# Patient Record
Sex: Female | Born: 1950 | Race: Black or African American | Hispanic: No | Smoking: Never smoker
Health system: Southern US, Community
[De-identification: ages and names within clinical notes are randomized; demographics above are authoritative.]

## PROBLEM LIST (undated history)

## (undated) DIAGNOSIS — R569 Unspecified convulsions: Secondary | ICD-10-CM

## (undated) HISTORY — PX: HERNIA REPAIR: SHX51

## (undated) HISTORY — DX: Unspecified convulsions: R56.9

---

## 2002-04-28 ENCOUNTER — Encounter: Admission: RE | Admit: 2002-04-28 | Discharge: 2002-04-28 | Payer: Self-pay | Admitting: Internal Medicine

## 2002-04-28 ENCOUNTER — Encounter: Payer: Self-pay | Admitting: Internal Medicine

## 2002-05-11 ENCOUNTER — Encounter: Admission: RE | Admit: 2002-05-11 | Discharge: 2002-08-09 | Payer: Self-pay | Admitting: Internal Medicine

## 2006-04-16 ENCOUNTER — Encounter (INDEPENDENT_AMBULATORY_CARE_PROVIDER_SITE_OTHER): Payer: Self-pay | Admitting: Cardiology

## 2006-04-16 ENCOUNTER — Ambulatory Visit (HOSPITAL_COMMUNITY): Admission: RE | Admit: 2006-04-16 | Discharge: 2006-04-16 | Payer: Self-pay | Admitting: *Deleted

## 2008-08-07 ENCOUNTER — Encounter: Admission: RE | Admit: 2008-08-07 | Discharge: 2008-08-07 | Payer: Self-pay | Admitting: Internal Medicine

## 2008-09-19 ENCOUNTER — Encounter: Admission: RE | Admit: 2008-09-19 | Discharge: 2008-09-19 | Payer: Self-pay | Admitting: Internal Medicine

## 2008-12-07 ENCOUNTER — Ambulatory Visit (HOSPITAL_COMMUNITY): Admission: RE | Admit: 2008-12-07 | Discharge: 2008-12-07 | Payer: Self-pay | Admitting: Gastroenterology

## 2010-12-30 NOTE — Op Note (Signed)
NAME:  Sherry Meyer, Sherry Meyer                 ACCOUNT NO.:  [id number]   MEDICAL RECORD NO.:  [id number]          PATIENT TYPE:  AMB   LOCATION:  ENDO                         FACILITY:  [facility]   PHYSICIAN:  Anselmo Rod, M.D.  DATE OF BIRTH:  09/25/1950   DATE OF PROCEDURE:  12/07/2008  DATE OF DISCHARGE:                               OPERATIVE REPORT   PROCEDURE PERFORMED:  Diagnostic colonoscopy.   ENDOSCOPIST:  Anselmo Rod, M.D.   INSTRUMENT USED:  Pentax video colonoscope.   INDICATIONS FOR PROCEDURE:  A 60 year old black female undergoing a  screening colonoscopy to rule out colonic polyps, masses, etc.   PREPROCEDURE PREPARATION:  Informed consent was procured from the  patient. The patient fasted for 8 hours prior to the procedure and  prepped with a bottle of magnesium citrate and MoviPrep the night prior  to the procedure.  Risks and benefits of the procedure including a 10%  miss rate of cancer and polyp were discussed with the patient as well.   PREPROCEDURE PHYSICAL:  The patient had stable vital signs.  NECK:  Supple.  CHEST:  Clear to auscultation.  S1 and S2 regular.  ABDOMEN:  Soft, with normal bowel sounds.   DESCRIPTION OF THE PROCEDURE:  The patient was placed in the left  lateral decubitus position and sedated with 75 mcg of Fentanyl and 8 mg  of Versed given intravenously in slow incremental doses. Once the  patient was adequately sedated and maintained on low-flow oxygen and  continuous cardiac monitoring, the Pentax video colonoscope was advanced  from the rectum to the cecum.  There was a significant amount of  residual stool in the colon.  Multiple washes were done. No masses,  polyps, erosions, ulcerations or diverticula were noted. Retroflexion in  the rectum revealed no abnormalities.  The appendiceal orifice and cecum  were clearly visualized and photographed.  Multiple washes were done as  the scope was gently withdrawn.  The patient tolerated the  procedure  well without immediate complications.   IMPRESSION:  1. Normal colonoscopy up to the cecum. No masses, polyps or      diverticula noted.  2. A large amount of residual stool in the colon, multiple washes, and      small lesions could be missed.   RECOMMENDATIONS:  1. Continue a high fiber diet with liberal fluid intake.  2. Outpatient followup as need arises in the future.  3. Repeat colonoscopy in the next 10 years. If the patient has any      abnormal symptoms in the interim, she is to contact the office      immediately for further recommendations.      Anselmo Rod, M.D.  Electronically Signed     JNM/MEDQ  D:  12/07/2008  T:  12/07/2008  Job:  [id number]   cc:   Candyce Churn. Allyne Gee, M.D.  Fax: [phone]

## 2013-09-04 ENCOUNTER — Emergency Department (HOSPITAL_COMMUNITY)
Admission: EM | Admit: 2013-09-04 | Discharge: 2013-09-04 | Disposition: A | Discharge status: Home / Self Care | Payer: 59 | Source: Home / Self Care

## 2013-09-04 ENCOUNTER — Encounter (HOSPITAL_COMMUNITY): Payer: Self-pay | Admitting: Emergency Medicine

## 2013-09-04 DIAGNOSIS — J069 Acute upper respiratory infection, unspecified: Secondary | ICD-10-CM

## 2013-09-04 NOTE — ED Provider Notes (Signed)
CSN: [id number]     Arrival date & time 09/04/13  0831 History   First MD Initiated Contact with Patient 09/04/13 0914     Chief Complaint  Patient presents with  . Chills   (Consider location/radiation/quality/duration/timing/severity/associated sxs/prior Treatment) HPI Comments: 63 year old female complaining of cough, fever and chills for 3 days. She is sitting on the bed, smiling, pleasant, cooperative in no acute distress.   History reviewed. No pertinent past medical history. History reviewed. No pertinent past surgical history. History reviewed. No pertinent family history. History  Substance Use Topics  . Smoking status: Never Smoker   . Smokeless tobacco: Not on file  . Alcohol Use: No   OB History   Grav Para Term Preterm Abortions TAB SAB Ect Mult Living                 Review of Systems  Constitutional: Positive for activity change and fatigue. Negative for fever, chills and appetite change.  HENT: Positive for congestion, postnasal drip and rhinorrhea. Negative for facial swelling.   Eyes: Negative.   Respiratory: Negative.   Cardiovascular: Negative.   Gastrointestinal: Negative.   Genitourinary: Negative.   Musculoskeletal: Negative for neck pain and neck stiffness.  Skin: Negative for pallor and rash.  Neurological: Negative.     Allergies  Review of patient's allergies indicates no known allergies.  Home Medications  No current outpatient prescriptions on file. BP 137/75  Pulse 70  Temp(Src) 100.5 F (38.1 C) (Oral)  Resp 16  SpO2 97% Physical Exam  Nursing note and vitals reviewed. Constitutional: She is oriented to person, place, and time. She appears well-developed and well-nourished. No distress.  HENT:  Head: Normocephalic.  Right Ear: External ear normal.  Left Ear: External ear normal.  Mouth/Throat: No oropharyngeal exudate.  OP. with scant PND and minor erythema  Eyes: Conjunctivae and EOM are normal.  Neck: Normal range of motion.  Neck supple.  Cardiovascular: Normal rate and regular rhythm.   Murmur heard. Pulmonary/Chest: Effort normal. No respiratory distress. She has no wheezes. She has no rales.  Musculoskeletal: Normal range of motion.  Lymphadenopathy:    She has no cervical adenopathy.  Neurological: She is alert and oriented to person, place, and time. No cranial nerve deficit.  Skin: Skin is warm and dry.  Psychiatric: She has a normal mood and affect.    ED Course  Procedures (including critical care time) Labs Review Labs Reviewed - No data to display Imaging Review No results found.   \  MDM   1. URI (upper respiratory infection)      Home, rest, drink plenty of fluids to stay well hydrated Continue with ibuprofen. Cranial new problems worsening, shortness of breath high fevers severe cough recheck properly.  Hayden Rasmussenavid Alanys Godino, NP 09/04/13 [phone]

## 2013-09-04 NOTE — ED Notes (Signed)
Pt  Reports  Symptoms  Of  Body  Aches    /  Chills    As  Well  As  A  persistant  Non  Productive  Cough  That  Will  Not  Subside           she is  Sitting  Upright on  Exam table  Speaking in  Complete  sentances

## 2013-09-04 NOTE — Discharge Instructions (Signed)
Upper Respiratory Infection, Adult °An upper respiratory infection (URI) is also sometimes known as the common cold. The upper respiratory tract includes the nose, sinuses, throat, trachea, and bronchi. Bronchi are the airways leading to the lungs. Most people improve within 1 week, but symptoms can last up to 2 weeks. A residual cough may last even longer.  °CAUSES °Many different viruses can infect the tissues lining the upper respiratory tract. The tissues become irritated and inflamed and often become very moist. Mucus production is also common. A cold is contagious. You can easily spread the virus to others by oral contact. This includes kissing, sharing a glass, coughing, or sneezing. Touching your mouth or nose and then touching a surface, which is then touched by another person, can also spread the virus. °SYMPTOMS  °Symptoms typically develop 1 to 3 days after you come in contact with a cold virus. Symptoms vary from person to person. They may include: °· Runny nose. °· Sneezing. °· Nasal congestion. °· Sinus irritation. °· Sore throat. °· Loss of voice (laryngitis). °· Cough. °· Fatigue. °· Muscle aches. °· Loss of appetite. °· Headache. °· Low-grade fever. °DIAGNOSIS  °You might diagnose your own cold based on familiar symptoms, since most people get a cold 2 to 3 times a year. Your caregiver can confirm this based on your exam. Most importantly, your caregiver can check that your symptoms are not due to another disease such as strep throat, sinusitis, pneumonia, asthma, or epiglottitis. Blood tests, throat tests, and X-rays are not necessary to diagnose a common cold, but they may sometimes be helpful in excluding other more serious diseases. Your caregiver will decide if any further tests are required. °RISKS AND COMPLICATIONS  °You may be at risk for a more severe case of the common cold if you smoke cigarettes, have chronic heart disease (such as heart failure) or lung disease (such as asthma), or if  you have a weakened immune system. The very young and very old are also at risk for more serious infections. Bacterial sinusitis, middle ear infections, and bacterial pneumonia can complicate the common cold. The common cold can worsen asthma and chronic obstructive pulmonary disease (COPD). Sometimes, these complications can require emergency medical care and may be life-threatening. °PREVENTION  °The best way to protect against getting a cold is to practice good hygiene. Avoid oral or hand contact with people with cold symptoms. Wash your hands often if contact occurs. There is no clear evidence that vitamin C, vitamin E, echinacea, or exercise reduces the chance of developing a cold. However, it is always recommended to get plenty of rest and practice good nutrition. °TREATMENT  °Treatment is directed at relieving symptoms. There is no cure. Antibiotics are not effective, because the infection is caused by a virus, not by bacteria. Treatment may include: °· Increased fluid intake. Sports drinks offer valuable electrolytes, sugars, and fluids. °· Breathing heated mist or steam (vaporizer or shower). °· Eating chicken soup or other clear broths, and maintaining good nutrition. °· Getting plenty of rest. °· Using gargles or lozenges for comfort. °· Controlling fevers with ibuprofen or acetaminophen as directed by your caregiver. °· Increasing usage of your inhaler if you have asthma. °Zinc gel and zinc lozenges, taken in the first 24 hours of the common cold, can shorten the duration and lessen the severity of symptoms. Pain medicines may help with fever, muscle aches, and throat pain. A variety of non-prescription medicines are available to treat congestion and runny nose. Your caregiver   can make recommendations and may suggest nasal or lung inhalers for other symptoms.  HOME CARE INSTRUCTIONS   Only take over-the-counter or prescription medicines for pain, discomfort, or fever as directed by your  caregiver.  Use a warm mist humidifier or inhale steam from a shower to increase air moisture. This may keep secretions moist and make it easier to breathe.  Drink enough water and fluids to keep your urine clear or pale yellow.  Rest as needed.  Return to work when your temperature has returned to normal or as your caregiver advises. You may need to stay home longer to avoid infecting others. You can also use a face mask and careful hand washing to prevent spread of the virus. SEEK MEDICAL CARE IF:   After the first few days, you feel you are getting worse rather than better.  You need your caregiver's advice about medicines to control symptoms.  You develop chills, worsening shortness of breath, or brown or red sputum. These may be signs of pneumonia.  You develop yellow or brown nasal discharge or pain in the face, especially when you bend forward. These may be signs of sinusitis.  You develop a fever, swollen neck glands, pain with swallowing, or white areas in the back of your throat. These may be signs of strep throat. SEEK IMMEDIATE MEDICAL CARE IF:   You have a fever.  You develop severe or persistent headache, ear pain, sinus pain, or chest pain.  You develop wheezing, a prolonged cough, cough up blood, or have a change in your usual mucus (if you have chronic lung disease).  You develop sore muscles or a stiff neck. Document Released: 01/27/2001 Document Revised: 10/26/2011 Document Reviewed: 12/05/2010 [facility] Patient Information 2014 [place], [place].  Viral Infections A viral infection can be caused by different types of viruses.Most viral infections are not serious and resolve on their own. However, some infections may cause severe symptoms and may lead to further complications. SYMPTOMS Viruses can frequently cause:  Minor sore throat.  Aches and pains.  Headaches.  Runny nose.  Different types of rashes.  Watery eyes.  Tiredness.  Cough.  Loss of  appetite.  Gastrointestinal infections, resulting in nausea, vomiting, and diarrhea. These symptoms do not respond to antibiotics because the infection is not caused by bacteria. However, you might catch a bacterial infection following the viral infection. This is sometimes called a "superinfection." Symptoms of such a bacterial infection may include:  Worsening sore throat with pus and difficulty swallowing.  Swollen neck glands.  Chills and a high or persistent fever.  Severe headache.  Tenderness over the sinuses.  Persistent overall ill feeling (malaise), muscle aches, and tiredness (fatigue).  Persistent cough.  Yellow, green, or brown mucus production with coughing. HOME CARE INSTRUCTIONS   Only take over-the-counter or prescription medicines for pain, discomfort, diarrhea, or fever as directed by your caregiver.  Drink enough water and fluids to keep your urine clear or pale yellow. Sports drinks can provide valuable electrolytes, sugars, and hydration.  Get plenty of rest and maintain proper nutrition. Soups and broths with crackers or rice are fine. SEEK IMMEDIATE MEDICAL CARE IF:   You have severe headaches, shortness of breath, chest pain, neck pain, or an unusual rash.  You have uncontrolled vomiting, diarrhea, or you are unable to keep down fluids.  You or your child has an oral temperature above 102 F (38.9 C), not controlled by medicine.  Your baby is older than 3 months with a rectal  temperature of 102 F (38.9 C) or higher.  Your baby is 493 months old or younger with a rectal temperature of 100.4 F (38 C) or higher. MAKE SURE YOU:   Understand these instructions.  Will watch your condition.  Will get help right away if you are not doing well or get worse. Document Released: 05/13/2005 Document Revised: 10/26/2011 Document Reviewed: 12/08/2010 [facility]Exit[facility] Patient Information 2014 [place], [place].  Antibiotic Nonuse  Your caregiver felt that the  infection or problem was not one that would be helped with an antibiotic. Infections may be caused by viruses or bacteria. Only a caregiver can tell which one of these is the likely cause of an illness. A cold is the most common cause of infection in both adults and children. A cold is a virus. Antibiotic treatment will have no effect on a viral infection. Viruses can lead to many lost days of work caring for sick children and many missed days of school. Children may catch as many as 10 "colds" or "flus" per year during which they can be tearful, cranky, and uncomfortable. The goal of treating a virus is aimed at keeping the ill person comfortable. Antibiotics are medications used to help the body fight bacterial infections. There are relatively few types of bacteria that cause infections but there are hundreds of viruses. While both viruses and bacteria cause infection they are very different types of germs. A viral infection will typically go away by itself within 7 to 10 days. Bacterial infections may spread or get worse without antibiotic treatment. Examples of bacterial infections are:  Sore throats (like strep throat or tonsillitis).  Infection in the lung (pneumonia).  Ear and skin infections. Examples of viral infections are:  Colds or flus.  Most coughs and bronchitis.  Sore throats not caused by Strep.  Runny noses. It is often best not to take an antibiotic when a viral infection is the cause of the problem. Antibiotics can kill off the helpful bacteria that we have inside our body and allow harmful bacteria to start growing. Antibiotics can cause side effects such as allergies, nausea, and diarrhea without helping to improve the symptoms of the viral infection. Additionally, repeated uses of antibiotics can cause bacteria inside of our body to become resistant. That resistance can be passed onto harmful bacterial. The next time you have an infection it may be harder to treat if  antibiotics are used when they are not needed. Not treating with antibiotics allows our own immune system to develop and take care of infections more efficiently. Also, antibiotics will work better for us when they are prescribed for bacterial infections. Treatments for a child that is ill may include:  Give extra fluids throughout the day to stay hydrated.  Get plenty of rest.  Only give your child over-the-counter or prescription medicines for pain, discomfort, or fever as directed by your caregiver.  The use of a cool mist humidifier may help stuffy noses.  Cold medications if suggested by your caregiver. Your caregiver may decide to start you on an antibiotic if:  The problem you were seen for today continues for a longer length of time than expected.  You develop a secondary bacterial infection. SEEK MEDICAL CARE IF:  Fever lasts longer than 5 days.  Symptoms continue to get worse after 5 to 7 days or become severe.  Difficulty in breathing develops.  Signs of dehydration develop (poor drinking, rare urinating, dark colored urine).  Changes in behavior or worsening tiredness (listlessness  or lethargy). Document Released: 10/12/2001 Document Revised: 10/26/2011 Document Reviewed: 04/10/2009 [facility] Patient Information 2014 [place], [place].

## 2013-09-04 NOTE — ED Provider Notes (Signed)
Medical screening examination/treatment/procedure(s) were performed by non-physician practitioner and as supervising physician I was immediately available for consultation/collaboration.  Leslee Homeavid Kahlie Deutscher, M.D.  Reuben Likesavid C Nyeem Stoke, MD 09/04/13 [phone]

## 2015-09-20 ENCOUNTER — Emergency Department (HOSPITAL_COMMUNITY)
Admission: EM | Admit: 2015-09-20 | Discharge: 2015-09-20 | Disposition: A | Discharge status: Home / Self Care | Payer: 59 | Attending: Emergency Medicine | Admitting: Emergency Medicine

## 2015-09-20 ENCOUNTER — Emergency Department (HOSPITAL_COMMUNITY): Payer: 59

## 2015-09-20 ENCOUNTER — Encounter (HOSPITAL_COMMUNITY): Payer: Self-pay

## 2015-09-20 DIAGNOSIS — R569 Unspecified convulsions: Secondary | ICD-10-CM | POA: Insufficient documentation

## 2015-09-20 DIAGNOSIS — Z791 Long term (current) use of non-steroidal anti-inflammatories (NSAID): Secondary | ICD-10-CM | POA: Diagnosis not present

## 2015-09-20 DIAGNOSIS — R402431 Glasgow coma scale score 3-8, in the field [EMT or ambulance]: Secondary | ICD-10-CM | POA: Diagnosis not present

## 2015-09-20 DIAGNOSIS — R112 Nausea with vomiting, unspecified: Secondary | ICD-10-CM | POA: Insufficient documentation

## 2015-09-20 DIAGNOSIS — E876 Hypokalemia: Secondary | ICD-10-CM | POA: Insufficient documentation

## 2015-09-20 DIAGNOSIS — R51 Headache: Secondary | ICD-10-CM | POA: Insufficient documentation

## 2015-09-20 LAB — CBC WITH DIFFERENTIAL/PLATELET
Basophils Absolute: 0 10*3/uL (ref 0.0–0.1)
Basophils Relative: 0 %
EOS PCT: 0 %
Eosinophils Absolute: 0 10*3/uL (ref 0.0–0.7)
HCT: 41.9 % (ref 36.0–46.0)
HEMOGLOBIN: 13.8 g/dL (ref 12.0–15.0)
LYMPHS ABS: 1.1 10*3/uL (ref 0.7–4.0)
LYMPHS PCT: 7 %
MCH: 28 pg (ref 26.0–34.0)
MCHC: 32.9 g/dL (ref 30.0–36.0)
MCV: 85.2 fL (ref 78.0–100.0)
MONOS PCT: 0 %
Monocytes Absolute: 0 10*3/uL — ABNORMAL LOW (ref 0.1–1.0)
Neutro Abs: 13.2 10*3/uL — ABNORMAL HIGH (ref 1.7–7.7)
Neutrophils Relative %: 93 %
PLATELETS: 282 10*3/uL (ref 150–400)
RBC: 4.92 MIL/uL (ref 3.87–5.11)
RDW: 13.9 % (ref 11.5–15.5)
WBC: 14.3 10*3/uL — AB (ref 4.0–10.5)

## 2015-09-20 LAB — URINALYSIS, ROUTINE W REFLEX MICROSCOPIC
Glucose, UA: NEGATIVE mg/dL
Ketones, ur: 15 mg/dL — AB
Leukocytes, UA: NEGATIVE
Nitrite: NEGATIVE
PH: 5 (ref 5.0–8.0)
Protein, ur: 100 mg/dL — AB
SPECIFIC GRAVITY, URINE: 1.022 (ref 1.005–1.030)

## 2015-09-20 LAB — COMPREHENSIVE METABOLIC PANEL
ALK PHOS: 61 U/L (ref 38–126)
ALT: 16 U/L (ref 14–54)
AST: 25 U/L (ref 15–41)
Albumin: 3.9 g/dL (ref 3.5–5.0)
Anion gap: 17 — ABNORMAL HIGH (ref 5–15)
BUN: 8 mg/dL (ref 6–20)
CALCIUM: 9.3 mg/dL (ref 8.9–10.3)
CO2: 22 mmol/L (ref 22–32)
CREATININE: 1.08 mg/dL — AB (ref 0.44–1.00)
Chloride: 101 mmol/L (ref 101–111)
GFR, EST NON AFRICAN AMERICAN: 53 mL/min — AB (ref >60)
Glucose, Bld: 185 mg/dL — ABNORMAL HIGH (ref 65–99)
Potassium: 3.3 mmol/L — ABNORMAL LOW (ref 3.5–5.1)
Sodium: 140 mmol/L (ref 135–145)
TOTAL PROTEIN: 8.2 g/dL — AB (ref 6.5–8.1)
Total Bilirubin: 0.5 mg/dL (ref 0.3–1.2)

## 2015-09-20 LAB — URINE MICROSCOPIC-ADD ON: BACTERIA UA: NONE SEEN

## 2015-09-20 LAB — BASIC METABOLIC PANEL
Anion gap: 11 (ref 5–15)
BUN: 7 mg/dL (ref 6–20)
CALCIUM: 8.9 mg/dL (ref 8.9–10.3)
CO2: 23 mmol/L (ref 22–32)
CREATININE: 0.85 mg/dL (ref 0.44–1.00)
Chloride: 106 mmol/L (ref 101–111)
GFR calc non Af Amer: >60 mL/min (ref >60)
GLUCOSE: 113 mg/dL — AB (ref 65–99)
Potassium: 3.2 mmol/L — ABNORMAL LOW (ref 3.5–5.1)
Sodium: 140 mmol/L (ref 135–145)

## 2015-09-20 MED ORDER — POTASSIUM CHLORIDE CRYS ER 20 MEQ PO TBCR
40.0000 meq | EXTENDED_RELEASE_TABLET | Freq: Once | ORAL | Status: AC
  Administered 2015-09-20: 40 meq via ORAL
  Filled 2015-09-20: qty 2

## 2015-09-20 MED ORDER — ONDANSETRON 4 MG PO TBDP: 4.0000 mg | ORAL_TABLET | Freq: Three times a day (TID) | ORAL | Status: AC | PRN

## 2015-09-20 MED ORDER — SODIUM CHLORIDE 0.9 % IV BOLUS (SEPSIS)
1000.0000 mL | Freq: Once | INTRAVENOUS | Status: AC
  Administered 2015-09-20: 1000 mL via INTRAVENOUS

## 2015-09-20 MED ORDER — POTASSIUM CHLORIDE ER 20 MEQ PO TBCR: 20.0000 meq | EXTENDED_RELEASE_TABLET | Freq: Every day | ORAL | Status: AC

## 2015-09-20 MED ORDER — ONDANSETRON 4 MG PO TBDP
4.0000 mg | ORAL_TABLET | Freq: Once | ORAL | Status: AC
  Administered 2015-09-20: 4 mg via ORAL
  Filled 2015-09-20: qty 1

## 2015-09-20 MED ORDER — DICYCLOMINE HCL 20 MG PO TABS: 20.0000 mg | ORAL_TABLET | Freq: Two times a day (BID) | ORAL | Status: AC

## 2015-09-20 NOTE — ED Notes (Signed)
Pt unable to give sample at this time 
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
## 2015-09-20 NOTE — ED Notes (Signed)
EDP at bedside  

## 2015-09-20 NOTE — ED Provider Notes (Signed)
CSN: [id number]     Arrival date & time 09/20/15  1501 History   First MD Initiated Contact with Patient 09/20/15 1503     Chief Complaint  Patient presents with  . Seizures    HPI  Ms. Ruckus is an 65 y.o. female who presents to the ED for evaluation following a witnessed seizure. She states for the past two weeks she has had intermittent abdominal pain, n/v, constipation. States she thought there was a GI bug going around as family members had similar symptoms. She states that today she was getting ready to go to [facility] for evaluation of abdominal pain and does not remember what happened next. She is accompanied by her son who witnessed the event. He states pt was sitting in a chair getting her shoes on when she suddenly complained of right arm cramping up. States that then her right arm started shaking, then her entire body went rigid and started shaking. States she was foaming at her mouth so he laid her on the ground on her side. He states that then pt went completely limp. He thinks the episode lasted ~21min. Pt was apparently post-ictal at time of EMS arrival. In the ED now pt appears tired but is mentating appropriately. She states she feels tired but back to baseline. She states she has never had a seizure before. She states she has been taking miralax for the past couple of days because she feels constipated (last BM 2 or 3 days ago) but otherwise no new medications. She does not see a PCP regularly and has no known medical issues. She does not take any m edications regularly. She states nothing like this has ever happened before.  History reviewed. No pertinent past medical history. History reviewed. No pertinent past surgical history. History reviewed. No pertinent family history. Social History  Substance Use Topics  . Smoking status: Never Smoker   . Smokeless tobacco: None  . Alcohol Use: No   OB History    No data available     Review of Systems  All other systems reviewed and  are negative.     Allergies  Review of patient's allergies indicates no known allergies.  Home Medications   Prior to Admission medications   Medication Sig Start Date End Date Taking? Authorizing Provider  Ibuprofen-Diphenhydramine HCl (ADVIL PM) 200-25 MG CAPS Take 1 tablet by mouth every evening. sleep   Yes Historical Provider, MD   BP 110/59 mmHg  Pulse 98  Temp(Src) 98.1 F (36.7 C) (Oral)  Resp 20  Ht 5\' 8"  (1.727 m)  Wt 102.513 kg  BMI 34.37 kg/m2  SpO2 94% Physical Exam  Constitutional: She is oriented to person, place, and time.  HENT:  Right Ear: External ear normal.  Left Ear: External ear normal.  Nose: Nose normal.  Mouth/Throat: Oropharynx is clear and moist. No oropharyngeal exudate.  Eyes: Conjunctivae and EOM are normal. Pupils are equal, round, and reactive to light.  Neck: Normal range of motion. Neck supple.  Cardiovascular: Normal rate, regular rhythm, normal heart sounds and intact distal pulses.   Pulmonary/Chest: Effort normal and breath sounds normal. No respiratory distress. She has no wheezes. She exhibits no tenderness.  Abdominal: Soft. Bowel sounds are normal. She exhibits no distension. There is no tenderness. There is no rebound and no guarding.  Musculoskeletal: She exhibits no edema.  Neurological: She is alert and oriented to person, place, and time. She has normal strength. She displays no tremor. No cranial nerve deficit  or sensory deficit. Coordination normal. GCS eye subscore is 4. GCS verbal subscore is 5. GCS motor subscore is 6.  Skin: Skin is warm and dry.  Psychiatric: She has a normal mood and affect.  Nursing note and vitals reviewed.  Filed Vitals:   09/20/15 1933 09/20/15 1934 09/20/15 2000 09/20/15 2047  BP: 129/73  119/45 111/55  Pulse:  81 69 87  Temp:      TempSrc:      Resp:  Height:      Weight:      SpO2:  97% 93% 93%     ED Course  Procedures (including critical care time) Labs Review Labs  Reviewed  CBC WITH DIFFERENTIAL/PLATELET - Abnormal; Notable for the following:    WBC 14.3 (*)    Neutro Abs 13.2 (*)    Monocytes Absolute 0.0 (*)    All other components within normal limits  COMPREHENSIVE METABOLIC PANEL - Abnormal; Notable for the following:    Potassium 3.3 (*)    Glucose, Bld 185 (*)    Creatinine, Ser 1.08 (*)    Total Protein 8.2 (*)    GFR calc non Af Amer 53 (*)    Anion gap 17 (*)    All other components within normal limits  URINALYSIS, ROUTINE W REFLEX MICROSCOPIC (NOT AT [facility]) - Abnormal; Notable for the following:    APPearance TURBID (*)    Hgb urine dipstick SMALL (*)    Bilirubin Urine SMALL (*)    Ketones, ur 15 (*)    Protein, ur 100 (*)    All other components within normal limits  URINE MICROSCOPIC-ADD ON - Abnormal; Notable for the following:    Squamous Epithelial / LPF 0-5 (*)    Casts GRANULAR CAST (*)    All other components within normal limits  BASIC METABOLIC PANEL - Abnormal; Notable for the following:    Potassium 3.2 (*)    Glucose, Bld 113 (*)    All other components within normal limits    Imaging Review Ct Head Wo Contrast  09/20/2015  CLINICAL DATA:  Possible seizure, headache EXAM: CT HEAD WITHOUT CONTRAST TECHNIQUE: Contiguous axial images were obtained from the base of the skull through the vertex without contrast. COMPARISON:  None FINDINGS: Normal appearance of the intracranial structures. No evidence for acute hemorrhage, mass lesion, midline shift, hydrocephalus or large infarct. No acute bony abnormality. The visualized sinuses are clear. IMPRESSION: No acute intracranial abnormality. Electronically Signed   By: Judie Petit.  Shick M.D.   On: 09/20/2015 16:26   I have personally reviewed and evaluated these images and lab results as part of my medical decision-making.   EKG Interpretation   Date/Time:  Friday September 20 2015 15:11:01 EST Ventricular Rate:  97 PR Interval:  144 QRS Duration: 86 QT Interval:  357 QTC  Calculation: 453 R Axis:   65 Text Interpretation:  Sinus rhythm Borderline repolarization abnormality  No previous ECGs available Confirmed by YAO  MD, DAVID ([id number]) on 09/20/2015  3:33:41 PM      MDM   Final diagnoses:  Observed seizure-like activity (HCC)  Non-intractable vomiting with nausea, vomiting of unspecified type  Hypokalemia    Pt is an 65 y.o. female who presents with likely seizure. This is her first seizure. Her CT head was unremarkable. She has a white count of 14.3, mild hypokalemia to 3.3. Cr 1.08, gap of 17. 1L NS bolus and K+ given in the ED. Discussed with attending  MD Silverio Lay. Pt otherwise with nonfocal exam and is back to baseline. Will repeat BMP to ensure improvement in anion gap. Pt abdominal exam is completely nontender with no guarding or rigidity, no rebound. Will hold off on CT abd/pelvis at this time.   Repeat BMP shows anion gap down to 11. VSS with nonfocal exam. Mentation at baseline. Will d/c home with rx for anti-emetics and bentyl given likely viral GI illness. Pt may continue her home miralax for constipation prn. Instructed to f/u with PCP. Pt states she has not seen PCP in "a long time" but in the past had followed regularly with Dr. Dorothyann Peng. Instructed to f/u with Dr. Allyne Gee next week. Referral also given to neuro. ER return precautions given.    Carlene Coria, PA-C 09/20/15 2156  Richardean Canal, MD 09/22/15 [phone]

## 2015-09-20 NOTE — Discharge Instructions (Signed)
You were seen in the emergency room today for evaluation of a possible seizure. Your CT scan of your head was normal. Your potassium was a little bit low. I will give you a prescription for a few days of supplementation. We gave you IV fluids and nausea medicine in the emergency room. Your other symptoms are likely due to a virus. I will give you prescriptions for medicine for the nausea and abdominal pain. Drink plenty of fluids to stay hydrated. Return to the ER for new or worsening symptoms.

## 2015-09-20 NOTE — ED Notes (Signed)
Pt ambulated to the bathroom with assistance 

## 2015-09-23 ENCOUNTER — Ambulatory Visit (INDEPENDENT_AMBULATORY_CARE_PROVIDER_SITE_OTHER): Payer: 59 | Admitting: Internal Medicine

## 2015-09-23 VITALS — BP 142/90 | HR 92 | Temp 99.1°F | Resp 16 | Ht 68.0 in | Wt 257.0 lb

## 2015-09-23 DIAGNOSIS — R5383 Other fatigue: Secondary | ICD-10-CM

## 2015-09-23 DIAGNOSIS — I499 Cardiac arrhythmia, unspecified: Secondary | ICD-10-CM | POA: Diagnosis not present

## 2015-09-23 DIAGNOSIS — R7309 Other abnormal glucose: Secondary | ICD-10-CM | POA: Diagnosis not present

## 2015-09-23 DIAGNOSIS — R110 Nausea: Secondary | ICD-10-CM

## 2015-09-23 DIAGNOSIS — R739 Hyperglycemia, unspecified: Secondary | ICD-10-CM

## 2015-09-23 DIAGNOSIS — Z6839 Body mass index (BMI) 39.0-39.9, adult: Secondary | ICD-10-CM | POA: Insufficient documentation

## 2015-09-23 DIAGNOSIS — E876 Hypokalemia: Secondary | ICD-10-CM | POA: Diagnosis not present

## 2015-09-23 LAB — POCT CBC
Granulocyte percent: 69.2 %G (ref 37–80)
HCT, POC: 45 % (ref 37.7–47.9)
HEMOGLOBIN: 14.5 g/dL (ref 12.2–16.2)
LYMPH, POC: 2.6 (ref 0.6–3.4)
MCH, POC: 27 pg (ref 27–31.2)
MCHC: 32.2 g/dL (ref 31.8–35.4)
MCV: 83.9 fL (ref 80–97)
MID (cbc): 0.8 (ref 0–0.9)
MPV: 7.9 fL (ref 0–99.8)
POC GRANULOCYTE: 7.5 — AB (ref 2–6.9)
POC LYMPH PERCENT: 23.7 %L (ref 10–50)
POC MID %: 7.1 %M (ref 0–12)
Platelet Count, POC: 341 10*3/uL (ref 142–424)
RBC: 5.37 M/uL (ref 4.04–5.48)
RDW, POC: 14.9 %
WBC: 10.9 10*3/uL — AB (ref 4.6–10.2)

## 2015-09-23 NOTE — Progress Notes (Signed)
Subjective:  By signing my name below, I, Essence Howell, attest that this documentation has been prepared under the direction and in the presence of Tonye Pearson, MD Electronically Signed: Charline Bills, ED Scribe 09/23/2015 at 7:34 PM.   Patient ID: Sherry Meyer, female    DOB: 03-25-1951, 65 y.o.   MRN: [id number]  Chief Complaint  Patient presents with  . Fatigue    x 3 days   . cant eat    pt hasn't eaten anything in 4 days   . Constipation   HPI HPI Comments: Sherry Meyer is a 65 y.o. female, who presents to the [facility] complaining of nausea, poor intake and persistent fatigue for the past 3 days. Pt was seen in the ED via ems after witnessed seizure on 09/20/15 -dx GI illness and Meyer episode of seizure. No other h/o seizures.  She reports associated chills, subjective fever, loss of appetite, nausea, constipation, increased belching, increased hiccups, mild diarrhea on day 1&2, emesis for the past 4-5 days ago. She denies difficulty urinating, SOB, leg swelling, further seizures.  No h/o DM or thyroid disease.  Dr Allyne Gee last PCP buthealthcare rare/overdue for all health maint.  There are no active problems to display for this patient. obesity should be listed  Past Medical History  Diagnosis Date  . Seizures (HCC)    Current Outpatient Prescriptions on File Prior to Visit  Medication Sig Dispense Refill  . dicyclomine (BENTYL) 20 MG tablet Take 1 tablet (20 mg total) by mouth 2 (two) times daily. 20 tablet 0  . Ibuprofen-Diphenhydramine HCl (ADVIL PM) 200-25 MG CAPS Take 1 tablet by mouth every evening. sleep    . ondansetron (ZOFRAN ODT) 4 MG disintegrating tablet Take 1 tablet (4 mg total) by mouth every 8 (eight) hours as needed for nausea or vomiting. 20 tablet 0  . potassium chloride 20 MEQ TBCR Take 20 mEq by mouth daily. 3 tablet 0   No current facility-administered medications on file prior to visit.   No Known Allergies  Review  of Systems  Constitutional: Positive for fever (subjective), chills, appetite change and fatigue.  Respiratory: Negative for shortness of breath.   Cardiovascular: Negative for leg swelling.  Gastrointestinal: Positive for nausea, vomiting, diarrhea (resolved) and constipation.  Genitourinary: Negative for difficulty urinating.  Neurological: Negative for seizures.      Objective:   Physical Exam  Constitutional: She is oriented to person, place, and time. She appears well-developed and well-nourished. No distress.  HENT:  Head: Normocephalic and atraumatic.  Eyes: Conjunctivae and EOM are normal.  Neck: Neck supple. No thyromegaly present.  Cardiovascular: An irregular rhythm present.  Murmur heard.  Systolic murmur is present with a grade of 2/6  Irregular rate with every other beat early. 2/6 systolic injection murmur RSB.  Pulmonary/Chest: Effort normal. No respiratory distress.  Abdominal: Soft. Bowel sounds are normal. She exhibits no distension and no mass. There is no tenderness.  Musculoskeletal: Normal range of motion. She exhibits no edema.  Lymphadenopathy:    She has no cervical adenopathy.  Neurological: She is alert and oriented to person, place, and time.  Skin: Skin is warm and dry.  Psychiatric: She has a normal mood and affect. Her behavior is normal.  Nursing note and vitals reviewed. BP 142/90 mmHg  Pulse 92  Temp(Src) 99.1 F (37.3 C) (Oral)  Resp 16  Ht  (1.727 m)  Wt 257 lb (116.574 kg)  BMI 39.09 kg/m2  SpO2 97%  EKG shows only rate variation and not bigeminy as heard on exam Results for orders placed or performed in visit on 09/23/15  POCT CBC  Result Value Ref Range   WBC 10.9 (A) 4.6 - 10.2 K/uL   Lymph, poc 2.6 0.6 - 3.4   POC LYMPH PERCENT 23.7 10 - 50 %L   MID (cbc) 0.8 0 - 0.9   POC MID % 7.1 0 - 12 %M   POC Granulocyte 7.5 (A) 2 - 6.9   Granulocyte percent 69.2 37 - 80 %G   RBC 5.37 4.04 - 5.48 M/uL   Hemoglobin 14.5 12.2 - 16.2  g/dL   HCT, POC 69.6 29.5 - 47.9 %   MCV 83.9 80 - 97 fL   MCH, POC 27.0 27 - 31.2 pg   MCHC 32.2 31.8 - 35.4 g/dL   RDW, POC 28.4 %   Platelet Count, POC 341 142 - 424 K/uL   MPV 7.9 0 - 99.8 fL       Assessment & Plan:  Hypokalemia - Plan: Comprehensive metabolic panel  Nausea -occas vom/decr appetite  Other fatigue - Plan: TSH  Irregular heart rate - Plan: TSH  Hyperglycemia on er labs - Plan: Hemoglobin A1c  BMI 39  Borderline BP/Ht M aortic  Lack of healthcare--needs cpe/HM exam  --small freq feedings of electrolyte soln and soups --contin zofran and KCL from ER but d/c dicyclomine --reck Cr and r/o DM   I have completed the patient encounter in its entirety as documented by the scribe, with editing by me where necessary. Yi Haugan P. Merla Riches, M.D.

## 2015-09-24 ENCOUNTER — Emergency Department (HOSPITAL_COMMUNITY): Payer: 59

## 2015-09-24 ENCOUNTER — Inpatient Hospital Stay (HOSPITAL_COMMUNITY): Payer: 59 | Admitting: Anesthesiology

## 2015-09-24 ENCOUNTER — Encounter (HOSPITAL_COMMUNITY): Payer: Self-pay | Admitting: Cardiology

## 2015-09-24 ENCOUNTER — Inpatient Hospital Stay (HOSPITAL_COMMUNITY)
Admission: EM | Admit: 2015-09-24 | Discharge: 2015-09-28 | DRG: 355 | Disposition: A | Discharge status: Home / Self Care | Payer: 59 | Attending: General Surgery | Admitting: General Surgery

## 2015-09-24 ENCOUNTER — Encounter (HOSPITAL_COMMUNITY): Admission: EM | Disposition: A | Discharge status: Home / Self Care | Payer: Self-pay | Source: Home / Self Care

## 2015-09-24 DIAGNOSIS — K460 Unspecified abdominal hernia with obstruction, without gangrene: Secondary | ICD-10-CM

## 2015-09-24 DIAGNOSIS — R1110 Vomiting, unspecified: Secondary | ICD-10-CM | POA: Diagnosis not present

## 2015-09-24 DIAGNOSIS — K430 Incisional hernia with obstruction, without gangrene: Secondary | ICD-10-CM | POA: Diagnosis not present

## 2015-09-24 DIAGNOSIS — Z419 Encounter for procedure for purposes other than remedying health state, unspecified: Secondary | ICD-10-CM

## 2015-09-24 DIAGNOSIS — E876 Hypokalemia: Secondary | ICD-10-CM | POA: Diagnosis present

## 2015-09-24 DIAGNOSIS — K5660 Unspecified intestinal obstruction: Secondary | ICD-10-CM | POA: Diagnosis not present

## 2015-09-24 DIAGNOSIS — K469 Unspecified abdominal hernia without obstruction or gangrene: Secondary | ICD-10-CM | POA: Diagnosis not present

## 2015-09-24 DIAGNOSIS — K429 Umbilical hernia without obstruction or gangrene: Secondary | ICD-10-CM | POA: Diagnosis not present

## 2015-09-24 DIAGNOSIS — E860 Dehydration: Secondary | ICD-10-CM | POA: Diagnosis present

## 2015-09-24 DIAGNOSIS — K56609 Unspecified intestinal obstruction, unspecified as to partial versus complete obstruction: Secondary | ICD-10-CM

## 2015-09-24 DIAGNOSIS — Z6839 Body mass index (BMI) 39.0-39.9, adult: Secondary | ICD-10-CM | POA: Diagnosis not present

## 2015-09-24 HISTORY — PX: INCISIONAL HERNIA REPAIR: SHX193

## 2015-09-24 LAB — URINALYSIS, ROUTINE W REFLEX MICROSCOPIC
GLUCOSE, UA: NEGATIVE mg/dL
Hgb urine dipstick: NEGATIVE
Ketones, ur: >80 mg/dL — AB
NITRITE: NEGATIVE
PH: 5 (ref 5.0–8.0)
Protein, ur: 30 mg/dL — AB
SPECIFIC GRAVITY, URINE: 1.027 (ref 1.005–1.030)

## 2015-09-24 LAB — TSH: TSH: 1.21 m[IU]/L

## 2015-09-24 LAB — CBC
HEMATOCRIT: 45 % (ref 36.0–46.0)
HEMOGLOBIN: 15 g/dL (ref 12.0–15.0)
MCH: 27.8 pg (ref 26.0–34.0)
MCHC: 33.3 g/dL (ref 30.0–36.0)
MCV: 83.3 fL (ref 78.0–100.0)
Platelets: 346 10*3/uL (ref 150–400)
RBC: 5.4 MIL/uL — ABNORMAL HIGH (ref 3.87–5.11)
RDW: 13.9 % (ref 11.5–15.5)
WBC: 12.4 10*3/uL — ABNORMAL HIGH (ref 4.0–10.5)

## 2015-09-24 LAB — COMPREHENSIVE METABOLIC PANEL
ALBUMIN: 3.8 g/dL (ref 3.5–5.0)
ALK PHOS: 55 U/L (ref 33–130)
ALT: 22 U/L (ref 6–29)
ALT: 26 U/L (ref 14–54)
ANION GAP: 17 — AB (ref 5–15)
AST: 27 U/L (ref 10–35)
AST: 26 U/L (ref 15–41)
Albumin: 4.3 g/dL (ref 3.6–5.1)
Alkaline Phosphatase: 56 U/L (ref 38–126)
BUN: 15 mg/dL (ref 7–25)
BUN: 14 mg/dL (ref 6–20)
CHLORIDE: 98 mmol/L (ref 98–110)
CHLORIDE: 97 mmol/L — AB (ref 101–111)
CO2: 20 mmol/L (ref 20–31)
CO2: 21 mmol/L — AB (ref 22–32)
CREATININE: 0.87 mg/dL (ref 0.50–0.99)
Calcium: 9 mg/dL (ref 8.6–10.4)
Calcium: 9.2 mg/dL (ref 8.9–10.3)
Creatinine, Ser: 0.94 mg/dL (ref 0.44–1.00)
GFR calc Af Amer: >60 mL/min (ref >60)
GFR calc non Af Amer: >60 mL/min (ref >60)
GLUCOSE: 108 mg/dL — AB (ref 65–99)
GLUCOSE: 118 mg/dL — AB (ref 65–99)
POTASSIUM: 3.4 mmol/L — AB (ref 3.5–5.1)
Potassium: 3.7 mmol/L (ref 3.5–5.3)
SODIUM: 136 mmol/L (ref 135–146)
SODIUM: 135 mmol/L (ref 135–145)
TOTAL PROTEIN: 7.9 g/dL (ref 6.1–8.1)
TOTAL PROTEIN: 7.9 g/dL (ref 6.5–8.1)
Total Bilirubin: 0.6 mg/dL (ref 0.2–1.2)
Total Bilirubin: 1.1 mg/dL (ref 0.3–1.2)

## 2015-09-24 LAB — LIPASE, BLOOD: LIPASE: 32 U/L (ref 11–51)

## 2015-09-24 LAB — URINE MICROSCOPIC-ADD ON

## 2015-09-24 SURGERY — REPAIR, HERNIA, INCISIONAL
Anesthesia: General | Site: Abdomen

## 2015-09-24 MED ORDER — PANTOPRAZOLE SODIUM 40 MG IV SOLR
40.0000 mg | Freq: Every day | INTRAVENOUS | Status: DC
Start: 2015-09-24 — End: 2015-09-27
  Administered 2015-09-25 – 2015-09-26 (×3): 40 mg via INTRAVENOUS
  Filled 2015-09-24 (×3): qty 40

## 2015-09-24 MED ORDER — LIDOCAINE HCL (CARDIAC) 20 MG/ML IV SOLN
INTRAVENOUS | Status: DC | PRN
  Administered 2015-09-24: 50 mg via INTRAVENOUS

## 2015-09-24 MED ORDER — FENTANYL CITRATE (PF) 250 MCG/5ML IJ SOLN
INTRAMUSCULAR | Status: AC
  Filled 2015-09-24: qty 5

## 2015-09-24 MED ORDER — PROPOFOL 10 MG/ML IV BOLUS
INTRAVENOUS | Status: DC | PRN
  Administered 2015-09-24: 160 mg via INTRAVENOUS

## 2015-09-24 MED ORDER — LACTATED RINGERS IV SOLN
INTRAVENOUS | Status: DC | PRN
  Administered 2015-09-24 (×2): via INTRAVENOUS

## 2015-09-24 MED ORDER — SODIUM CHLORIDE 0.9 % IV BOLUS (SEPSIS)
1000.0000 mL | Freq: Once | INTRAVENOUS | Status: AC
  Administered 2015-09-24: 1000 mL via INTRAVENOUS

## 2015-09-24 MED ORDER — DEXAMETHASONE SODIUM PHOSPHATE 4 MG/ML IJ SOLN
INTRAMUSCULAR | Status: AC
  Filled 2015-09-24: qty 1

## 2015-09-24 MED ORDER — ENOXAPARIN SODIUM 40 MG/0.4ML ~~LOC~~ SOLN
40.0000 mg | SUBCUTANEOUS | Status: DC
  Administered 2015-09-25 – 2015-09-27 (×3): 40 mg via SUBCUTANEOUS
  Filled 2015-09-24 (×3): qty 0.4

## 2015-09-24 MED ORDER — IOHEXOL 300 MG/ML  SOLN
100.0000 mL | Freq: Once | INTRAMUSCULAR | Status: AC | PRN
  Administered 2015-09-24: 100 mL via INTRAVENOUS

## 2015-09-24 MED ORDER — EPHEDRINE SULFATE 50 MG/ML IJ SOLN
INTRAMUSCULAR | Status: DC | PRN
  Administered 2015-09-24: 10 mg via INTRAVENOUS

## 2015-09-24 MED ORDER — HYDROMORPHONE HCL 1 MG/ML IJ SOLN
INTRAMUSCULAR | Status: AC
  Administered 2015-09-24: 0.5 mg via INTRAVENOUS
  Filled 2015-09-24: qty 1

## 2015-09-24 MED ORDER — PHENYLEPHRINE HCL 10 MG/ML IJ SOLN
INTRAMUSCULAR | Status: AC
  Filled 2015-09-24: qty 1

## 2015-09-24 MED ORDER — SUGAMMADEX SODIUM 500 MG/5ML IV SOLN
INTRAVENOUS | Status: DC | PRN
  Administered 2015-09-24: 250 mg via INTRAVENOUS

## 2015-09-24 MED ORDER — MORPHINE SULFATE (PF) 2 MG/ML IV SOLN
2.0000 mg | INTRAVENOUS | Status: DC | PRN
Start: 2015-09-24 — End: 2015-09-28
  Administered 2015-09-25: 2 mg via INTRAVENOUS
  Filled 2015-09-24: qty 1

## 2015-09-24 MED ORDER — 0.9 % SODIUM CHLORIDE (POUR BTL) OPTIME
TOPICAL | Status: DC | PRN
  Administered 2015-09-24: 2000 mL

## 2015-09-24 MED ORDER — CEFAZOLIN SODIUM-DEXTROSE 2-3 GM-% IV SOLR
2.0000 g | INTRAVENOUS | Status: AC
  Administered 2015-09-24: 2 g via INTRAVENOUS
  Filled 2015-09-24: qty 50

## 2015-09-24 MED ORDER — DEXAMETHASONE SODIUM PHOSPHATE 4 MG/ML IJ SOLN
INTRAMUSCULAR | Status: DC | PRN
  Administered 2015-09-24: 4 mg via INTRAVENOUS

## 2015-09-24 MED ORDER — PHENYLEPHRINE HCL 10 MG/ML IJ SOLN
INTRAMUSCULAR | Status: DC | PRN
  Administered 2015-09-24: 80 ug via INTRAVENOUS

## 2015-09-24 MED ORDER — PROPOFOL 10 MG/ML IV BOLUS
INTRAVENOUS | Status: AC
  Filled 2015-09-24: qty 20

## 2015-09-24 MED ORDER — ROCURONIUM BROMIDE 100 MG/10ML IV SOLN
INTRAVENOUS | Status: DC | PRN
  Administered 2015-09-24: 10 mg via INTRAVENOUS
  Administered 2015-09-24: 30 mg via INTRAVENOUS
  Administered 2015-09-24: 10 mg via INTRAVENOUS

## 2015-09-24 MED ORDER — ONDANSETRON 4 MG PO TBDP: 4.0000 mg | ORAL_TABLET | Freq: Four times a day (QID) | ORAL | Status: DC | PRN

## 2015-09-24 MED ORDER — SUGAMMADEX SODIUM 500 MG/5ML IV SOLN
INTRAVENOUS | Status: AC
  Filled 2015-09-24: qty 5

## 2015-09-24 MED ORDER — PROMETHAZINE HCL 25 MG/ML IJ SOLN: 6.2500 mg | INTRAMUSCULAR | Status: DC | PRN

## 2015-09-24 MED ORDER — ACETAMINOPHEN 650 MG RE SUPP: 650.0000 mg | Freq: Four times a day (QID) | RECTAL | Status: DC | PRN

## 2015-09-24 MED ORDER — KCL IN DEXTROSE-NACL 20-5-0.9 MEQ/L-%-% IV SOLN
INTRAVENOUS | Status: DC
  Administered 2015-09-25 – 2015-09-26 (×3): via INTRAVENOUS
  Filled 2015-09-24 (×7): qty 1000

## 2015-09-24 MED ORDER — ACETAMINOPHEN 325 MG PO TABS: 650.0000 mg | ORAL_TABLET | Freq: Four times a day (QID) | ORAL | Status: DC | PRN

## 2015-09-24 MED ORDER — FENTANYL CITRATE (PF) 100 MCG/2ML IJ SOLN
100.0000 ug | Freq: Once | INTRAMUSCULAR | Status: AC
  Administered 2015-09-24: 100 ug via INTRAVENOUS

## 2015-09-24 MED ORDER — ONDANSETRON HCL 4 MG/2ML IJ SOLN
4.0000 mg | Freq: Once | INTRAMUSCULAR | Status: AC
  Administered 2015-09-24: 4 mg via INTRAVENOUS
  Filled 2015-09-24: qty 2

## 2015-09-24 MED ORDER — ONDANSETRON HCL 4 MG/2ML IJ SOLN: 4.0000 mg | Freq: Four times a day (QID) | INTRAMUSCULAR | Status: DC | PRN

## 2015-09-24 MED ORDER — SUCCINYLCHOLINE CHLORIDE 20 MG/ML IJ SOLN
INTRAMUSCULAR | Status: DC | PRN
  Administered 2015-09-24: 130 mg via INTRAVENOUS

## 2015-09-24 MED ORDER — FENTANYL CITRATE (PF) 100 MCG/2ML IJ SOLN
INTRAMUSCULAR | Status: AC
  Administered 2015-09-24: 100 ug via INTRAVENOUS
  Filled 2015-09-24: qty 2

## 2015-09-24 MED ORDER — HYDROMORPHONE HCL 1 MG/ML IJ SOLN
0.2500 mg | INTRAMUSCULAR | Status: DC | PRN
  Administered 2015-09-24 (×2): 0.5 mg via INTRAVENOUS

## 2015-09-24 MED ORDER — FENTANYL CITRATE (PF) 100 MCG/2ML IJ SOLN
INTRAMUSCULAR | Status: DC | PRN
  Administered 2015-09-24 (×5): 50 ug via INTRAVENOUS

## 2015-09-24 MED ORDER — SODIUM CHLORIDE 0.9 % IV SOLN: INTRAVENOUS | Status: DC

## 2015-09-24 MED ORDER — MIDAZOLAM HCL 2 MG/2ML IJ SOLN
INTRAMUSCULAR | Status: AC
  Filled 2015-09-24: qty 2

## 2015-09-24 MED ORDER — CEFAZOLIN SODIUM-DEXTROSE 2-3 GM-% IV SOLR
INTRAVENOUS | Status: AC
  Filled 2015-09-24: qty 50

## 2015-09-24 MED ORDER — PHENYLEPHRINE HCL 10 MG/ML IJ SOLN
10.0000 mg | INTRAVENOUS | Status: DC | PRN
  Administered 2015-09-24: 100 ug/min via INTRAVENOUS

## 2015-09-24 SURGICAL SUPPLY — 49 items
BLADE SURG ROTATE 9660 (MISCELLANEOUS) IMPLANT
CANISTER SUCTION 2500CC (MISCELLANEOUS) ×3 IMPLANT
CHLORAPREP W/TINT 26ML (MISCELLANEOUS) ×3 IMPLANT
COVER MAYO STAND STRL (DRAPES) IMPLANT
COVER SURGICAL LIGHT HANDLE (MISCELLANEOUS) ×3 IMPLANT
DRAPE LAPAROSCOPIC ABDOMINAL (DRAPES) ×3 IMPLANT
DRAPE PROXIMA HALF (DRAPES) IMPLANT
DRAPE UTILITY XL STRL (DRAPES) IMPLANT
DRAPE WARM FLUID 44X44 (DRAPE) ×3 IMPLANT
DRSG OPSITE POSTOP 4X10 (GAUZE/BANDAGES/DRESSINGS) IMPLANT
DRSG OPSITE POSTOP 4X8 (GAUZE/BANDAGES/DRESSINGS) ×3 IMPLANT
ELECT BLADE 6.5 EXT (BLADE) IMPLANT
ELECT CAUTERY BLADE 6.4 (BLADE) IMPLANT
ELECT REM PT RETURN 9FT ADLT (ELECTROSURGICAL) ×3
ELECTRODE REM PT RTRN 9FT ADLT (ELECTROSURGICAL) ×2 IMPLANT
GLOVE BIO SURGEON STRL SZ 6 (GLOVE) ×3 IMPLANT
GLOVE BIOGEL PI IND STRL 6.5 (GLOVE) ×10 IMPLANT
GLOVE BIOGEL PI IND STRL 7.5 (GLOVE) ×2 IMPLANT
GLOVE BIOGEL PI INDICATOR 6.5 (GLOVE) ×5
GLOVE BIOGEL PI INDICATOR 7.5 (GLOVE) ×1
GLOVE SURG SS PI 6.5 STRL IVOR (GLOVE) ×6 IMPLANT
GLOVE SURG SS PI 7.5 STRL IVOR (GLOVE) ×3 IMPLANT
GOWN STRL REUS W/ TWL LRG LVL3 (GOWN DISPOSABLE) ×4 IMPLANT
GOWN STRL REUS W/TWL 2XL LVL3 (GOWN DISPOSABLE) ×3 IMPLANT
GOWN STRL REUS W/TWL LRG LVL3 (GOWN DISPOSABLE) ×2
KIT BASIN OR (CUSTOM PROCEDURE TRAY) ×3 IMPLANT
KIT ROOM TURNOVER OR (KITS) ×3 IMPLANT
LIGASURE IMPACT 36 18CM CVD LR (INSTRUMENTS) IMPLANT
NS IRRIG 1000ML POUR BTL (IV SOLUTION) ×6 IMPLANT
PACK GENERAL/GYN (CUSTOM PROCEDURE TRAY) ×3 IMPLANT
PAD ARMBOARD 7.5X6 YLW CONV (MISCELLANEOUS) ×3 IMPLANT
PENCIL BUTTON HOLSTER BLD 10FT (ELECTRODE) IMPLANT
SPECIMEN JAR LARGE (MISCELLANEOUS) IMPLANT
SPONGE LAP 18X18 X RAY DECT (DISPOSABLE) IMPLANT
STAPLER VISISTAT 35W (STAPLE) IMPLANT
SUCTION POOLE TIP (SUCTIONS) ×3 IMPLANT
SUT NOVA 1 T20/GS 25DT (SUTURE) ×9 IMPLANT
SUT PDS II 0 TP-1 LOOPED 60 (SUTURE) IMPLANT
SUT VIC AB 2-0 SH 18 (SUTURE) ×6 IMPLANT
SUT VIC AB 3-0 SH 18 (SUTURE) ×6 IMPLANT
SUT VICRYL 4-0 PS2 18IN ABS (SUTURE) IMPLANT
SUT VICRYL AB 2 0 TIES (SUTURE) ×3 IMPLANT
SUT VICRYL AB 3 0 TIES (SUTURE) ×3 IMPLANT
TOWEL OR 17X24 6PK STRL BLUE (TOWEL DISPOSABLE) ×3 IMPLANT
TOWEL OR 17X26 10 PK STRL BLUE (TOWEL DISPOSABLE) ×3 IMPLANT
TRAY FOLEY CATH 16FRSI W/METER (SET/KITS/TRAYS/PACK) ×3 IMPLANT
TRAY FOLEY W/METER SILVER 14FR (SET/KITS/TRAYS/PACK) ×3 IMPLANT
TUBE CONNECTING 12X1/4 (SUCTIONS) IMPLANT
YANKAUER SUCT BULB TIP NO VENT (SUCTIONS) IMPLANT

## 2015-09-24 NOTE — ED Provider Notes (Signed)
CSN: [id number]     Arrival date & time 09/24/15  6578 History   First MD Initiated Contact with Patient 09/24/15 1137     Chief Complaint  Patient presents with  . Emesis  . Abdominal Pain  . Constipation     (Consider location/radiation/quality/duration/timing/severity/associated sxs/prior Treatment) HPI  Pt presenting with c/o abdominal pain, vomiting and constipation. Pt states symptoms began approx 1 week ago- she has continued to have vomiting- is able to keep down small amount of liquids but has not had any solid food.  No fever/chills.  No cough or difficulty breathing.  No diarrhea.  She currently denies having abdominal pain but states that she is concerned she doesn't feel the lower part of her abdomen rumbling.  There are no other associated systemic symptoms, there are no other alleviating or modifying factors.   Past Medical History  Diagnosis Date  . Seizures [facility])    Past Surgical History  Procedure Laterality Date  . Hernia repair     Family History  Problem Relation Age of Onset  . Heart disease Mother   . Cancer Father    Social History  Substance Use Topics  . Smoking status: Never Smoker   . Smokeless tobacco: Never Used  . Alcohol Use: No   OB History    No data available     Review of Systems  ROS reviewed and all otherwise negative except for mentioned in HPI    Allergies  Review of patient's allergies indicates no known allergies.  Home Medications   Prior to Admission medications   Medication Sig Start Date End Date Taking? Authorizing Provider  Ibuprofen-Diphenhydramine HCl (ADVIL PM) 200-25 MG CAPS Take 1 tablet by mouth at bedtime as needed (sleep). sleep   Yes Historical Provider, MD  ondansetron (ZOFRAN ODT) 4 MG disintegrating tablet Take 1 tablet (4 mg total) by mouth every 8 (eight) hours as needed for nausea or vomiting. 09/20/15  Yes Ace Gins Sam, PA-C  potassium chloride 20 MEQ TBCR Take 20 mEq by mouth daily. 09/20/15  Yes Ace Gins  Sam, PA-C  dicyclomine (BENTYL) 20 MG tablet Take 1 tablet (20 mg total) by mouth 2 (two) times daily. Patient not taking: Reported on 09/24/2015 09/20/15   Ace Gins Sam, PA-C   BP 157/85 mmHg  Pulse 69  Temp(Src) 98.9 F (37.2 C) (Oral)  Resp 17  Ht  (1.727 m)  Wt 116.574 kg  BMI 39.09 kg/m2  SpO2 99%  Vitals reviewed Physical Exam  Physical Examination: General appearance - alert, well appearing, and in no distress Mental status - alert, oriented to person, place, and time Eyes - no conjunctival injection Mouth - mucous membranes moist, pharynx normal without lesions Chest - clear to auscultation, no wheezes, rales or rhonchi, symmetric air entry Heart - normal rate, regular rhythm, normal S1, S2, no murmurs, rubs, clicks or gallops Abdomen - soft, ttp in mid abdomen, no gaurding or rebound tenderness, nabs, nondistended, no masses or organomegaly Neurological - alert, oriented, normal speech Extremities - peripheral pulses normal, no pedal edema, no clubbing or cyanosis Skin - normal coloration and turgor, no rashes  ED Course  Procedures (including critical care time) Labs Review Labs Reviewed  COMPREHENSIVE METABOLIC PANEL - Abnormal; Notable for the following:    Potassium 3.4 (*)    Chloride 97 (*)    CO2 21 (*)    Glucose, Bld 118 (*)    Anion gap 17 (*)    All other components  within normal limits  CBC - Abnormal; Notable for the following:    WBC 12.4 (*)    RBC 5.40 (*)    All other components within normal limits  URINALYSIS, ROUTINE W REFLEX MICROSCOPIC (NOT AT [facility]) - Abnormal; Notable for the following:    Color, Urine AMBER (*)    Bilirubin Urine MODERATE (*)    Ketones, ur >80 (*)    Protein, ur 30 (*)    Leukocytes, UA TRACE (*)    All other components within normal limits  URINE MICROSCOPIC-ADD ON - Abnormal; Notable for the following:    Squamous Epithelial / LPF 0-5 (*)    Bacteria, UA RARE (*)    All other components within normal limits   LIPASE, BLOOD    Imaging Review Ct Abdomen Pelvis W Contrast  09/24/2015  CLINICAL DATA:  65 year old female with abdominal pain vomiting and constipation for 6 days. Initial encounter. EXAM: CT ABDOMEN AND PELVIS WITH CONTRAST TECHNIQUE: Multidetector CT imaging of the abdomen and pelvis was performed using the standard protocol following bolus administration of intravenous contrast. CONTRAST:  OMNIPAQUE IOHEXOL 300 MG/ML  SOLN COMPARISON:  Acute abdominal series today. FINDINGS: Tiny calcified granuloma at the left lung base. Minimal lung base scarring or atelectasis. No pericardial or pleural effusion. Mild grade 1 anterolisthesis in the lower lumbar spine with associated advanced facet arthropathy. No acute osseous abnormality identified. No pelvic free fluid. Diminutive urinary bladder. There is a small 2 cm simple fluid attenuation cyst associated with the left ovary (series 2, image 85). Otherwise negative in uterus and adnexa. Decompressed large bowel from the mid transverse colon to the rectum. The mid transverse colon is herniated into a right paracentral paraumbilical with a relatively narrow neck (sagittal image 77). This hernias obstructing the colon, which is then moderately to severely dilated with fluid proximally. There is also widespread fluid dilatation of small bowel. There is also some retained fluid in the thoracic esophagus. Appendix incidentally noted to be normal. The hernia sac is 10-12 cm diameter, and contains large bowel and mesentery. No abdominal free air or free fluid. Liver, gallbladder, spleen, pancreas and adrenal glands are within normal limits. Portal venous system is patent. Aortoiliac calcified atherosclerosis noted. Major arterial structures are patent. Bilateral renal enhancement and contrast excretion within normal limits ; small benign bilateral parapelvic renal cysts. No lymphadenopathy. IMPRESSION: 1. Herniated and obstructed transverse colon; incarcerated  periumbilical hernia with relatively (narrow neck 3-4 cm versus 12 cm hernia) containing the mid transverse colon and mesenteric. Upstream high-grade bowel obstruction, but no free air or free fluid in the abdomen or pelvis. 2. Otherwise benign findings in the abdomen and pelvis, including a small simple appearing left ovarian cyst which in this age group would be recommended for annual pelvis ultrasound follow-up This recommendation follows the consensus statement: Management of Asymptomatic Ovarian and Other Adnexal Cysts Imaged at [place]: Society of Radiologists in Ultrasound Consensus Conference Statement. Radiology 2010; [phone].). Electronically Signed   By: Odessa Fleming M.D.   On: 09/24/2015 15:12   Dg Abd Acute W/chest  09/24/2015  CLINICAL DATA:  Vomiting for 3 days. EXAM: DG ABDOMEN ACUTE W/ 1V CHEST COMPARISON:  Chest radiographs 08/07/2008. FINDINGS: The heart size and mediastinal contours are stable with mild aortic atherosclerosis. There is chronic asymmetric elevation of the left hemidiaphragm. Bibasilar atelectasis has improved compared with the prior study. There is no confluent airspace opacity, edema or pleural effusion. There are several moderately dilated loops of small bowel  within the mid abdomen with air-fluid levels at different levels on the erect examination. The colon appears decompressed. There is no evidence of free intraperitoneal air, definite bowel wall thickening or suspicious abdominal calcification. IMPRESSION: 1. The bowel gas pattern is consistent with a mid small bowel obstruction. Consider CT for further evaluation of potential etiology. 2. No evidence of bowel perforation. 3. Mild bibasilar atelectasis, improved from 2009 chest radiographs. Electronically Signed   By: Carey Bullocks M.D.   On: 09/24/2015 13:27   I have personally reviewed and evaluated these images and lab results as part of my medical decision-making.   EKG Interpretation None      MDM   Final  diagnoses:  Colon obstruction (HCC)  Incarcerated hernia   Pt presenting with c/o abdominal pain associated with vomiting.  Pt states symptoms have been present for the past week.  Urine shows signs of dehydration. Xray shows possible obstruction.  Ct abdomen shows incarcerated hernia involving transverse colon with high grade obstruction.  Surgery has seen patient and will admit her.    3:56 PM d/w surgery, they will see patient in the ED.    Jerelyn Scott, MD 09/24/15 [phone]

## 2015-09-24 NOTE — Op Note (Signed)
Repair incarcerated incisional ventral hernia  Indications: Pt with prior hiatal hernia repair.  Now with obstruction, colon incarcerated near lower portion of incision.    Pre-operative Diagnosis: incarcerated ventral incisional hernia with obstruction  Post-operative Diagnosis: same  Surgeon: Almond Lint   Anesthesia: General endotracheal anesthesia  ASA Class: 3  Procedure Details  The patient was seen in the Holding Room. The risks, benefits, complications, treatment options, and expected outcomes were discussed with the patient. The possibilities of reaction to medication, pulmonary aspiration, perforation of viscus, bleeding, recurrent infection, the need for additional procedures, failure to diagnose a condition, and creating a complication requiring transfusion or operation were discussed with the patient. The patient concurred with the proposed plan, giving informed consent.  The site of surgery properly noted/marked. The patient was taken to the operating room, identified as Sherry Meyer and the procedure verified as icarcerated ventral incisional hernia repair. A Time Out was held and the above information confirmed.  The patient was placed supine.  After establishing general anesthesia, the abdomen was prepped with Chloraprep and draped in sterile fashion.  We made a vertical incision over the palpable hernia in the location of her prior incision.   Dissection was carried down to the hernia sac located above the fascia and mobilized from surrounding structures. The neck of the hernia was small compared to the size of there hernia.   Omentum and transverse colon were incarcerated in the hernia sac.   There were several other satellite hernias that were small, but contained incarcerated omentum.  These were all connected together.  The sac was resected with cautery.   Intact fascia was identified circumferentially around the defect.   The fascial defect was reapproximated with  interrupted #1 Novofil sutures, taking care to protect the underlying viscera.   The subcutaneous tissues were irrigated. The empty space was reapproximated with 2 layers of interrupted vicryls to minimize risk of large seroma.      Instrument, sponge, and needle counts were correct prior to closure and at the conclusion of the case.   Findings: Incarcerated ventral incisional hernia with omentum and transverse colon.  4 other surrounding small hernias.    Estimated Blood Loss:  less than 100 mL   Complications:  None; patient tolerated the procedure well.         Disposition: PACU - hemodynamically stable.         Condition: stable

## 2015-09-24 NOTE — Anesthesia Preprocedure Evaluation (Signed)
Anesthesia Evaluation  Patient identified by MRN, date of birth, ID band Patient awake    Reviewed: Allergy & Precautions, NPO status , Patient's Chart, lab work & pertinent test results  Airway Mallampati: II  TM Distance: <3 FB Neck ROM: Full    Dental no notable dental hx.    Pulmonary neg pulmonary ROS,    Pulmonary exam normal breath sounds clear to auscultation       Cardiovascular negative cardio ROS Normal cardiovascular exam Rhythm:Regular Rate:Normal     Neuro/Psych negative neurological ROS  negative psych ROS   GI/Hepatic negative GI ROS, Neg liver ROS,   Endo/Other  Morbid obesity  Renal/GU negative Renal ROS  negative genitourinary   Musculoskeletal negative musculoskeletal ROS (+)   Abdominal   Peds negative pediatric ROS (+)  Hematology negative hematology ROS (+)   Anesthesia Other Findings   Reproductive/Obstetrics negative OB ROS                             Anesthesia Physical Anesthesia Plan  ASA: III  Anesthesia Plan: General   Post-op Pain Management:    Induction: Intravenous and Rapid sequence  Airway Management Planned: Oral ETT  Additional Equipment:   Intra-op Plan:   Post-operative Plan: Extubation in OR  Informed Consent: I have reviewed the patients History and Physical, chart, labs and discussed the procedure including the risks, benefits and alternatives for the proposed anesthesia with the patient or authorized representative who has indicated his/her understanding and acceptance.   Dental advisory given  Plan Discussed with: CRNA and Surgeon  Anesthesia Plan Comments:         Anesthesia Quick Evaluation

## 2015-09-24 NOTE — Transfer of Care (Signed)
Immediate Anesthesia Transfer of Care Note  Patient: Sherry Meyer  Procedure(s) Performed: Procedure(s): REPAIR OF INCARCERATED  INCISIONAL HERNIA (N/A)  Patient Location: PACU  Anesthesia Type:General  Level of Consciousness: awake, alert  and patient cooperative  Airway & Oxygen Therapy: Patient Spontanous Breathing and Patient connected to nasal cannula oxygen  Post-op Assessment: Report given to RN and Post -op Vital signs reviewed and stable  Post vital signs: Reviewed and stable  Last Vitals:  Filed Vitals:   09/24/15 1600 09/24/15 1700  BP: 157/85 162/90  Pulse: 69 76  Temp:    Resp:  18    Complications: No apparent anesthesia complications

## 2015-09-24 NOTE — Anesthesia Procedure Notes (Signed)
Procedure Name: Intubation Date/Time: 09/24/2015 7:26 PM Performed by: Julianne Rice Z Pre-anesthesia Checklist: Patient identified, Timeout performed, Emergency Drugs available, Suction available and Patient being monitored Patient Re-evaluated:Patient Re-evaluated prior to inductionOxygen Delivery Method: Circle system utilized Preoxygenation: Pre-oxygenation with 100% oxygen Intubation Type: IV induction, Rapid sequence and Cricoid Pressure applied Laryngoscope Size: Mac and 3 Grade View: Grade I Tube type: Oral Tube size: 7.5 mm Number of attempts: 1 Airway Equipment and Method: Stylet Placement Confirmation: ETT inserted through vocal cords under direct vision,  breath sounds checked- equal and bilateral and positive ETCO2 Secured at: 22 cm Tube secured with: Tape Dental Injury: Teeth and Oropharynx as per pre-operative assessment

## 2015-09-24 NOTE — ED Notes (Addendum)
Pt reports abd pain with vomiting and constipation for about a week. States she was recently seen at [facility] for the same but is feeling no better. Also c/o constipation.

## 2015-09-24 NOTE — ED Notes (Signed)
Called report to [place], short stay. Take pt to room 33.

## 2015-09-24 NOTE — Progress Notes (Signed)
Report given to Jo, RN.

## 2015-09-24 NOTE — Progress Notes (Signed)
Orthopedic Tech Progress Note Patient Details:  Sherry Meyer [birth year] [id number] Delivered abdominal binder to pt.'s nurse. Patient ID: Sherry Meyer, female   DOB: 07/05/1951, 65 y.o.   MRN: [id number]   [name] 09/24/2015, 9:27 PM

## 2015-09-24 NOTE — H&P (Signed)
Chief Complaint: abdominal pain HPI: Sherry Meyer is a 65 year old female with a history of seizures who presents with persistent abdominal pain, nausea and vomiting.  Started last Wednesday.  She was seen in the ED on Friday due to a presumed seizure.  AXR at this time suggested a PSBO.  She was sent home, symptoms never improved.  Has not been able to keep much liquids down.  Associated with chills, constipation, last BM being 1 week ago.  She has a history of a hiatal hernia repair in 1991.  Denies any cardiac history.  Work up shows, WBC 12.4k, K 3.4, normal renal function.  CT of a/p showed a incarcerated umbilical hernia with obstruction.  We have therefore been asked to evaluate.   Past Medical History  Diagnosis Date  . Seizures [facility])     Past Surgical History  Procedure Laterality Date  . Hernia repair      Family History  Problem Relation Age of Onset  . Heart disease Mother   . Cancer Father    Social History:  reports that she has never smoked. She has never used smokeless tobacco. She reports that she does not drink alcohol or use illicit drugs.  Allergies: No Known Allergies   (Not in a hospital admission)  Results for orders placed or performed during the hospital encounter of 09/24/15 (from the past 48 hour(s))  Lipase, blood     Status: None   Collection Time: 09/24/15 10:27 AM  Result Value Ref Range   Lipase 32 11 - 51 U/L  Comprehensive metabolic panel     Status: Abnormal   Collection Time: 09/24/15 10:27 AM  Result Value Ref Range   Sodium 135 135 - 145 mmol/L   Potassium 3.4 (L) 3.5 - 5.1 mmol/L   Chloride 97 (L) 101 - 111 mmol/L   CO2 21 (L) 22 - 32 mmol/L   Glucose, Bld 118 (H) 65 - 99 mg/dL   BUN 14 6 - 20 mg/dL   Creatinine, Ser 0.94 0.44 - 1.00 mg/dL   Calcium 9.2 8.9 - 10.3 mg/dL   Total Protein 7.9 6.5 - 8.1 g/dL   Albumin 3.8 3.5 - 5.0 g/dL   AST 26 15 - 41 U/L   ALT 26 14 - 54 U/L   Alkaline Phosphatase 56 38 - 126 U/L   Total Bilirubin  1.1 0.3 - 1.2 mg/dL   GFR calc non Af Amer >60 >60 mL/min   GFR calc Af Amer >60 >60 mL/min    Comment: (NOTE) The eGFR has been calculated using the CKD EPI equation. This calculation has not been validated in all clinical situations. eGFR's persistently <60 mL/min signify possible Chronic Kidney Disease.    Anion gap 17 (H) 5 - 15  CBC     Status: Abnormal   Collection Time: 09/24/15 10:27 AM  Result Value Ref Range   WBC 12.4 (H) 4.0 - 10.5 K/uL   RBC 5.40 (H) 3.87 - 5.11 MIL/uL   Hemoglobin 15.0 12.0 - 15.0 g/dL   HCT 45.0 36.0 - 46.0 %   MCV 83.3 78.0 - 100.0 fL   MCH 27.8 26.0 - 34.0 pg   MCHC 33.3 30.0 - 36.0 g/dL   RDW 13.9 11.5 - 15.5 %   Platelets 346 150 - 400 K/uL  Urinalysis, Routine w reflex microscopic (not at [facility])     Status: Abnormal   Collection Time: 09/24/15 11:35 AM  Result Value Ref Range   Color, Urine  AMBER (A) YELLOW    Comment: BIOCHEMICALS MAY BE AFFECTED BY COLOR   APPearance CLEAR CLEAR   Specific Gravity, Urine 1.027 1.005 - 1.030   pH 5.0 5.0 - 8.0   Glucose, UA NEGATIVE NEGATIVE mg/dL   Hgb urine dipstick NEGATIVE NEGATIVE   Bilirubin Urine MODERATE (A) NEGATIVE   Ketones, ur >80 (A) NEGATIVE mg/dL   Protein, ur 30 (A) NEGATIVE mg/dL   Nitrite NEGATIVE NEGATIVE   Leukocytes, UA TRACE (A) NEGATIVE  Urine microscopic-add on     Status: Abnormal   Collection Time: 09/24/15 11:35 AM  Result Value Ref Range   Squamous Epithelial / LPF 0-5 (A) NONE SEEN   WBC, UA 0-5 0 - 5 WBC/hpf   RBC / HPF 0-5 0 - 5 RBC/hpf   Bacteria, UA RARE (A) NONE SEEN   Ct Abdomen Pelvis W Contrast  09/24/2015  CLINICAL DATA:  65 year old female with abdominal pain vomiting and constipation for 6 days. Initial encounter. EXAM: CT ABDOMEN AND PELVIS WITH CONTRAST TECHNIQUE: Multidetector CT imaging of the abdomen and pelvis was performed using the standard protocol following bolus administration of intravenous contrast. CONTRAST:  162m OMNIPAQUE IOHEXOL 300 MG/ML  SOLN  COMPARISON:  Acute abdominal series today. FINDINGS: Tiny calcified granuloma at the left lung base. Minimal lung base scarring or atelectasis. No pericardial or pleural effusion. Mild grade 1 anterolisthesis in the lower lumbar spine with associated advanced facet arthropathy. No acute osseous abnormality identified. No pelvic free fluid. Diminutive urinary bladder. There is a small 2 cm simple fluid attenuation cyst associated with the left ovary (series 2, image 85). Otherwise negative in uterus and adnexa. Decompressed large bowel from the mid transverse colon to the rectum. The mid transverse colon is herniated into a right paracentral paraumbilical with a relatively narrow neck (sagittal image 77). This hernias obstructing the colon, which is then moderately to severely dilated with fluid proximally. There is also widespread fluid dilatation of small bowel. There is also some retained fluid in the thoracic esophagus. Appendix incidentally noted to be normal. The hernia sac is 10-12 cm diameter, and contains large bowel and mesentery. No abdominal free air or free fluid. Liver, gallbladder, spleen, pancreas and adrenal glands are within normal limits. Portal venous system is patent. Aortoiliac calcified atherosclerosis noted. Major arterial structures are patent. Bilateral renal enhancement and contrast excretion within normal limits ; small benign bilateral parapelvic renal cysts. No lymphadenopathy. IMPRESSION: 1. Herniated and obstructed transverse colon; incarcerated periumbilical hernia with relatively (narrow neck 3-4 cm versus 12 cm hernia) containing the mid transverse colon and mesenteric. Upstream high-grade bowel obstruction, but no free air or free fluid in the abdomen or pelvis. 2. Otherwise benign findings in the abdomen and pelvis, including a small simple appearing left ovarian cyst which in this age group would be recommended for annual pelvis ultrasound follow-up This recommendation follows  the consensus statement: Management of Asymptomatic Ovarian and Other Adnexal Cysts Imaged at [place] [employer] in [place] [facility] 2010; [phone]). Electronically Signed   By: HGenevie AnnM.D.   On: 09/24/2015 15:12   Dg Abd Acute W/chest  09/24/2015  CLINICAL DATA:  Vomiting for 3 days. EXAM: DG ABDOMEN ACUTE W/ 1V CHEST COMPARISON:  Chest radiographs 08/07/2008. FINDINGS: The heart size and mediastinal contours are stable with mild aortic atherosclerosis. There is chronic asymmetric elevation of the left hemidiaphragm. Bibasilar atelectasis has improved compared with the prior study. There is no confluent airspace opacity, edema or pleural  effusion. There are several moderately dilated loops of small bowel within the mid abdomen with air-fluid levels at different levels on the erect examination. The colon appears decompressed. There is no evidence of free intraperitoneal air, definite bowel wall thickening or suspicious abdominal calcification. IMPRESSION: 1. The bowel gas pattern is consistent with a mid small bowel obstruction. Consider CT for further evaluation of potential etiology. 2. No evidence of bowel perforation. 3. Mild bibasilar atelectasis, improved from 2009 chest radiographs. Electronically Signed   By: Richardean Sale M.D.   On: 09/24/2015 13:27    Review of Systems  Constitutional: Positive for chills and malaise/fatigue. Negative for fever and diaphoresis.  Eyes: Negative for blurred vision, double vision, photophobia, pain, discharge and redness.  Respiratory: Negative for cough, hemoptysis, sputum production, shortness of breath and wheezing.   Cardiovascular: Negative for chest pain, palpitations, orthopnea, claudication, leg swelling and PND.  Gastrointestinal: Positive for nausea, vomiting, abdominal pain and constipation. Negative for diarrhea, blood in stool and melena.  Genitourinary: Negative for dysuria, urgency, frequency,  hematuria and flank pain.  Musculoskeletal: Negative for myalgias, back pain, joint pain, falls and neck pain.  Neurological: Negative for dizziness, tingling, tremors, sensory change, speech change, focal weakness, seizures, loss of consciousness, weakness and headaches.    Blood pressure 157/85, pulse 69, temperature 98.9 F (37.2 C), temperature source Oral, resp. rate 17, height _0  (1.727 m), weight 116.574 kg (257 lb), SpO2 99 %. Physical Exam  Constitutional: She is oriented to person, place, and time. She appears well-developed and well-nourished.  Writhing in pain, in mild distress  Cardiovascular: Normal rate, regular rhythm, normal heart sounds and intact distal pulses.  Exam reveals no gallop and no friction rub.   No murmur heard. Respiratory: Effort normal and breath sounds normal. No respiratory distress. She has no wheezes. She has no rales. She exhibits no tenderness.  GI: Bowel sounds are normal.  Umbilical hernia, unable to reduce, tender, without erythema. Abdomen is otherwise soft.    Musculoskeletal: Normal range of motion. She exhibits no edema or tenderness.  Neurological: She is alert and oriented to person, place, and time.  Skin: Skin is warm and dry. No rash noted. She is not diaphoretic. No erythema. No pallor.  Psychiatric: She has a normal mood and affect. Her behavior is normal. Judgment and thought content normal.     Assessment/Plan Incarcerated umbilical hernia-to OR this evening for repair.  Surgical risks discussed including infection, bleeding, bowel resection, injury to bowel, recurrence of hernia, blood clots, heart attack, respiratory compromise. The patient verbalizes understanding and wishes to proceed.  Remote Hx seizures-monitor.  Not on home meds.  FEN-NPO, IVF with KCL ID-ancef on call to OR VTE prophylaxis-SCD/lovenox Dispo-to OR  Leviathan Macera, NP 09/24/2015, 4:21 PM

## 2015-09-24 NOTE — Anesthesia Postprocedure Evaluation (Signed)
Anesthesia Post Note  Patient: Sherry Meyer  Procedure(s) Performed: Procedure(s) (LRB): REPAIR OF INCARCERATED  INCISIONAL HERNIA (N/A)  Patient location during evaluation: PACU Anesthesia Type: General Level of consciousness: awake and alert Pain management: pain level controlled Vital Signs Assessment: post-procedure vital signs reviewed and stable Respiratory status: spontaneous breathing, nonlabored ventilation, respiratory function stable and patient connected to nasal cannula oxygen Cardiovascular status: blood pressure returned to baseline and stable Postop Assessment: no signs of nausea or vomiting Anesthetic complications: no    Last Vitals:  Filed Vitals:   09/24/15 2135 09/24/15 2150  BP:  152/79  Pulse: 81 78  Temp:    Resp: 15 13    Last Pain:  Filed Vitals:   09/24/15 2203  PainSc: 8                  Verma Grothaus S

## 2015-09-25 ENCOUNTER — Encounter: Payer: Self-pay | Admitting: Internal Medicine

## 2015-09-25 ENCOUNTER — Encounter (HOSPITAL_COMMUNITY): Payer: Self-pay | Admitting: General Surgery

## 2015-09-25 LAB — CBC
HCT: 38.6 % (ref 36.0–46.0)
Hemoglobin: 12.6 g/dL (ref 12.0–15.0)
MCH: 27.5 pg (ref 26.0–34.0)
MCHC: 32.6 g/dL (ref 30.0–36.0)
MCV: 84.3 fL (ref 78.0–100.0)
Platelets: 281 10*3/uL (ref 150–400)
RBC: 4.58 MIL/uL (ref 3.87–5.11)
RDW: 14.1 % (ref 11.5–15.5)
WBC: 12.4 10*3/uL — ABNORMAL HIGH (ref 4.0–10.5)

## 2015-09-25 LAB — BASIC METABOLIC PANEL
Anion gap: 9 (ref 5–15)
BUN: 9 mg/dL (ref 6–20)
CALCIUM: 8.1 mg/dL — AB (ref 8.9–10.3)
CO2: 25 mmol/L (ref 22–32)
Chloride: 105 mmol/L (ref 101–111)
Creatinine, Ser: 0.84 mg/dL (ref 0.44–1.00)
GFR calc Af Amer: >60 mL/min (ref >60)
GLUCOSE: 132 mg/dL — AB (ref 65–99)
Potassium: 3.6 mmol/L (ref 3.5–5.1)
Sodium: 139 mmol/L (ref 135–145)

## 2015-09-25 LAB — HEMOGLOBIN A1C
Hgb A1c MFr Bld: 6.1 % — ABNORMAL HIGH (ref <5.7)
Mean Plasma Glucose: 128 mg/dL — ABNORMAL HIGH (ref <117)

## 2015-09-25 MED ORDER — WHITE PETROLATUM GEL
Status: AC
  Administered 2015-09-25: 06:00:00
  Filled 2015-09-25: qty 1

## 2015-09-25 MED ORDER — CETYLPYRIDINIUM CHLORIDE 0.05 % MT LIQD
7.0000 mL | Freq: Two times a day (BID) | OROMUCOSAL | Status: DC
  Administered 2015-09-25 – 2015-09-27 (×3): 7 mL via OROMUCOSAL

## 2015-09-25 NOTE — Progress Notes (Signed)
Patient ID: Sherry Meyer, female   DOB: April 06, 1951, 65 y.o.   MRN: [id number]     [facility]      [address] [place], [place] [address]    Phone: [phone] FAX: [phone]     Subjective: No n/v.  Minimal NGT output. No flatus.  No oob yet. Good UOP.  Objective:  Vital signs:  Filed Vitals:   09/24/15 2304 09/24/15 2320 09/25/15 0229 09/25/15 0502  BP: 136/83 128/85 133/76 138/77  Pulse: 83 81 74 80  Temp:  98.1 F (36.7 C) 97.6 F (36.4 C) 98.4 F (36.9 C)  TempSrc:  Oral Oral Oral  Resp: _0 Height:      Weight:      SpO2: 98% 100% 94% 99%    Last BM Date: 09/18/15  Intake/Output   Yesterday:  02/07 0701 - 02/08 0700 In: 7680 [I.V.:3850; NG/GT:90] Out: 1700 [Urine:1625; Emesis/NG output:25; Blood:50] This shift:     Physical Exam: General: Pt awake/alert/oriented x4 in no acute distress Chest: cta.  No chest wall pain w good excursion CV:  Pulses intact.  Regular rhythm MS: Normal AROM mjr joints.  No obvious deformity Abdomen: +bs, abdomen is soft, incision is c/d/i.  Tender over incision.  Ext:  SCDs BLE.  No mjr edema.  No cyanosis Skin: No petechiae / purpura   Problem List:   Active Problems:   Incarcerated incisional hernia   Surgery, elective    Results:   Labs: Results for orders placed or performed during the hospital encounter of 09/24/15 (from the past 48 hour(s))  Lipase, blood     Status: None   Collection Time: 09/24/15 10:27 AM  Result Value Ref Range   Lipase 32 11 - 51 U/L  Comprehensive metabolic panel     Status: Abnormal   Collection Time: 09/24/15 10:27 AM  Result Value Ref Range   Sodium 135 135 - 145 mmol/L   Potassium 3.4 (L) 3.5 - 5.1 mmol/L   Chloride 97 (L) 101 - 111 mmol/L   CO2 21 (L) 22 - 32 mmol/L   Glucose, Bld 118 (H) 65 - 99 mg/dL   BUN 14 6 - 20 mg/dL   Creatinine, Ser 0.94 0.44 - 1.00 mg/dL   Calcium 9.2 8.9 - 10.3 mg/dL   Total Protein  7.9 6.5 - 8.1 g/dL   Albumin 3.8 3.5 - 5.0 g/dL   AST 26 15 - 41 U/L   ALT 26 14 - 54 U/L   Alkaline Phosphatase 56 38 - 126 U/L   Total Bilirubin 1.1 0.3 - 1.2 mg/dL   GFR calc non Af Amer >60 >60 mL/min   GFR calc Af Amer >60 >60 mL/min    Comment: (NOTE) The eGFR has been calculated using the CKD EPI equation. This calculation has not been validated in all clinical situations. eGFR's persistently <60 mL/min signify possible Chronic Kidney Disease.    Anion gap 17 (H) 5 - 15  CBC     Status: Abnormal   Collection Time: 09/24/15 10:27 AM  Result Value Ref Range   WBC 12.4 (H) 4.0 - 10.5 K/uL   RBC 5.40 (H) 3.87 - 5.11 MIL/uL   Hemoglobin 15.0 12.0 - 15.0 g/dL   HCT 45.0 36.0 - 46.0 %   MCV 83.3 78.0 - 100.0 fL   MCH 27.8 26.0 - 34.0 pg   MCHC 33.3 30.0 - 36.0 g/dL   RDW 13.9 11.5 -  15.5 %   Platelets 346 150 - 400 K/uL  Urinalysis, Routine w reflex microscopic (not at [facility])     Status: Abnormal   Collection Time: 09/24/15 11:35 AM  Result Value Ref Range   Color, Urine AMBER (A) YELLOW    Comment: BIOCHEMICALS MAY BE AFFECTED BY COLOR   APPearance CLEAR CLEAR   Specific Gravity, Urine 1.027 1.005 - 1.030   pH 5.0 5.0 - 8.0   Glucose, UA NEGATIVE NEGATIVE mg/dL   Hgb urine dipstick NEGATIVE NEGATIVE   Bilirubin Urine MODERATE (A) NEGATIVE   Ketones, ur >80 (A) NEGATIVE mg/dL   Protein, ur 30 (A) NEGATIVE mg/dL   Nitrite NEGATIVE NEGATIVE   Leukocytes, UA TRACE (A) NEGATIVE  Urine microscopic-add on     Status: Abnormal   Collection Time: 09/24/15 11:35 AM  Result Value Ref Range   Squamous Epithelial / LPF 0-5 (A) NONE SEEN   WBC, UA 0-5 0 - 5 WBC/hpf   RBC / HPF 0-5 0 - 5 RBC/hpf   Bacteria, UA RARE (A) NONE SEEN  Basic metabolic panel     Status: Abnormal   Collection Time: 09/25/15  5:42 AM  Result Value Ref Range   Sodium 139 135 - 145 mmol/L   Potassium 3.6 3.5 - 5.1 mmol/L   Chloride 105 101 - 111 mmol/L   CO2 25 22 - 32 mmol/L   Glucose, Bld 132 (H) 65 -  99 mg/dL   BUN 9 6 - 20 mg/dL   Creatinine, Ser 0.84 0.44 - 1.00 mg/dL   Calcium 8.1 (L) 8.9 - 10.3 mg/dL   GFR calc non Af Amer >60 >60 mL/min   GFR calc Af Amer >60 >60 mL/min    Comment: (NOTE) The eGFR has been calculated using the CKD EPI equation. This calculation has not been validated in all clinical situations. eGFR's persistently <60 mL/min signify possible Chronic Kidney Disease.    Anion gap 9 5 - 15  CBC     Status: Abnormal   Collection Time: 09/25/15  5:42 AM  Result Value Ref Range   WBC 12.4 (H) 4.0 - 10.5 K/uL   RBC 4.58 3.87 - 5.11 MIL/uL   Hemoglobin 12.6 12.0 - 15.0 g/dL   HCT 38.6 36.0 - 46.0 %   MCV 84.3 78.0 - 100.0 fL   MCH 27.5 26.0 - 34.0 pg   MCHC 32.6 30.0 - 36.0 g/dL   RDW 14.1 11.5 - 15.5 %   Platelets 281 150 - 400 K/uL    Imaging / Studies: Ct Abdomen Pelvis W Contrast  09/24/2015  CLINICAL DATA:  65 year old female with abdominal pain vomiting and constipation for 6 days. Initial encounter. EXAM: CT ABDOMEN AND PELVIS WITH CONTRAST TECHNIQUE: Multidetector CT imaging of the abdomen and pelvis was performed using the standard protocol following bolus administration of intravenous contrast. CONTRAST:  171m OMNIPAQUE IOHEXOL 300 MG/ML  SOLN COMPARISON:  Acute abdominal series today. FINDINGS: Tiny calcified granuloma at the left lung base. Minimal lung base scarring or atelectasis. No pericardial or pleural effusion. Mild grade 1 anterolisthesis in the lower lumbar spine with associated advanced facet arthropathy. No acute osseous abnormality identified. No pelvic free fluid. Diminutive urinary bladder. There is a small 2 cm simple fluid attenuation cyst associated with the left ovary (series 2, image 85). Otherwise negative in uterus and adnexa. Decompressed large bowel from the mid transverse colon to the rectum. The mid transverse colon is herniated into a right paracentral paraumbilical with a relatively  narrow neck (sagittal image 77). This hernias  obstructing the colon, which is then moderately to severely dilated with fluid proximally. There is also widespread fluid dilatation of small bowel. There is also some retained fluid in the thoracic esophagus. Appendix incidentally noted to be normal. The hernia sac is 10-12 cm diameter, and contains large bowel and mesentery. No abdominal free air or free fluid. Liver, gallbladder, spleen, pancreas and adrenal glands are within normal limits. Portal venous system is patent. Aortoiliac calcified atherosclerosis noted. Major arterial structures are patent. Bilateral renal enhancement and contrast excretion within normal limits ; small benign bilateral parapelvic renal cysts. No lymphadenopathy. IMPRESSION: 1. Herniated and obstructed transverse colon; incarcerated periumbilical hernia with relatively (narrow neck 3-4 cm versus 12 cm hernia) containing the mid transverse colon and mesenteric. Upstream high-grade bowel obstruction, but no free air or free fluid in the abdomen or pelvis. 2. Otherwise benign findings in the abdomen and pelvis, including a small simple appearing left ovarian cyst which in this age group would be recommended for annual pelvis ultrasound follow-up This recommendation follows the consensus statement: Management of Asymptomatic Ovarian and Other Adnexal Cysts Imaged at [place]: Society of Radiologists in [place]. Radiology 2010; [phone].). Electronically Signed   By: Genevie Ann M.D.   On: 09/24/2015 15:12   Dg Abd Acute W/chest  09/24/2015  CLINICAL DATA:  Vomiting for 3 days. EXAM: DG ABDOMEN ACUTE W/ 1V CHEST COMPARISON:  Chest radiographs 08/07/2008. FINDINGS: The heart size and mediastinal contours are stable with mild aortic atherosclerosis. There is chronic asymmetric elevation of the left hemidiaphragm. Bibasilar atelectasis has improved compared with the prior study. There is no confluent airspace opacity, edema or pleural effusion. There are several  moderately dilated loops of small bowel within the mid abdomen with air-fluid levels at different levels on the erect examination. The colon appears decompressed. There is no evidence of free intraperitoneal air, definite bowel wall thickening or suspicious abdominal calcification. IMPRESSION: 1. The bowel gas pattern is consistent with a mid small bowel obstruction. Consider CT for further evaluation of potential etiology. 2. No evidence of bowel perforation. 3. Mild bibasilar atelectasis, improved from 2009 chest radiographs. Electronically Signed   By: Richardean Sale M.D.   On: 09/24/2015 13:27    Medications / Allergies:  Scheduled Meds: . antiseptic oral rinse  7 mL Mouth Rinse BID  . enoxaparin (LOVENOX) injection  40 mg Subcutaneous Q24H  . pantoprazole (PROTONIX) IV  40 mg Intravenous QHS   Continuous Infusions: . sodium chloride    . dextrose 5 % and 0.9 % NaCl with KCl 20 mEq/L 100 mL/hr at 09/25/15 0100   PRN Meds:.acetaminophen **OR** acetaminophen, morphine injection, ondansetron **OR** ondansetron (ZOFRAN) IV  Antibiotics: Anti-infectives    Start     Dose/Rate Route Frequency Ordered Stop   09/24/15 1645  ceFAZolin (ANCEF) IVPB 2 g/50 mL premix    Comments:  Pharmacy may adjust dosing strength, interval, or rate of medication as needed for optimal therapy for the patient  Send with patient on call to the OR.  Anesthesia to complete antibiotic administration [facility]38mn [facility]   2 g 100 mL/hr over 30 Minutes Intravenous On call to O.R. 09/24/15 1637 09/24/15 1933        Assessment/Plan Incarcerated ventral incisional hernia with obstruction  POD#1 repair of incisional ventral hernia---Dr. BBarry Dienes-continue NGT, mobilize, continue foley today, IS, pain control VTE prophylaxis-SCD/lovenox FEN-ice chips, IVF Dispo-continue inpatient   Eward Rutigliano,  ANP-BC [facility] Pager [phone](7A-4:30P) For consults and floor pages  call [phone](7A-4:30P)  09/25/2015 8:26 AM

## 2015-09-25 NOTE — Progress Notes (Signed)
Patient NG tube clamped at 11 am. Residual checked and very minimum output noted. Estimated about 20 cc. Patient had no complaint of nausea or vomiting. NG tube removed. No complications.

## 2015-09-26 LAB — BASIC METABOLIC PANEL
ANION GAP: 12 (ref 5–15)
BUN: 6 mg/dL (ref 6–20)
CHLORIDE: 99 mmol/L — AB (ref 101–111)
CO2: 26 mmol/L (ref 22–32)
CREATININE: 0.83 mg/dL (ref 0.44–1.00)
Calcium: 8.3 mg/dL — ABNORMAL LOW (ref 8.9–10.3)
GFR calc non Af Amer: >60 mL/min (ref >60)
GLUCOSE: 121 mg/dL — AB (ref 65–99)
POTASSIUM: 3.1 mmol/L — AB (ref 3.5–5.1)
Sodium: 137 mmol/L (ref 135–145)

## 2015-09-26 MED ORDER — POTASSIUM CHLORIDE CRYS ER 20 MEQ PO TBCR
40.0000 meq | EXTENDED_RELEASE_TABLET | Freq: Two times a day (BID) | ORAL | Status: AC
  Administered 2015-09-26 (×2): 40 meq via ORAL
  Filled 2015-09-26 (×2): qty 2

## 2015-09-26 MED ORDER — OXYCODONE-ACETAMINOPHEN 5-325 MG PO TABS: 1.0000 | ORAL_TABLET | ORAL | Status: DC | PRN

## 2015-09-26 MED ORDER — DIPHENHYDRAMINE HCL 25 MG PO CAPS
25.0000 mg | ORAL_CAPSULE | Freq: Every day | ORAL | Status: DC
  Administered 2015-09-26 – 2015-09-27 (×2): 25 mg via ORAL
  Filled 2015-09-26 (×2): qty 1

## 2015-09-26 NOTE — Progress Notes (Signed)
Utilization review completed. Ian Castagna, RN, BSN. 

## 2015-09-26 NOTE — Progress Notes (Signed)
Patient ID: Thurmon Fair, female   DOB: 07-09-1951, 65 y.o.   MRN: [id number]     [facility]      [address] [place], [place] [address]    Phone: [phone] FAX: [phone]     Subjective: No n/v.  Passing flatus.  Ambulating. Little pain. k 3.1    Objective:  Vital signs:  Filed Vitals:   09/25/15 0937 09/25/15 2138 09/26/15 0543 09/26/15 0929  BP: 128/73 114/65 117/64 116/66  Pulse: 77 86 86 90  Temp: 98.3 F (36.8 C) 99.2 F (37.3 C) 98.7 F (37.1 C) 99.4 F (37.4 C)  TempSrc: Oral Oral Oral Oral  Resp: 16 17 18 16   Height:      Weight:      SpO2: 98% 94% 96% 96%    Last BM Date: 09/24/15  Intake/Output   Yesterday:  02/08 0701 - 02/09 0700 In: 548.3 [I.V.:548.3] Out: 340 [Urine:340] This shift:  Total I/O In: 0  Out: 150 [Urine:150]   Physical Exam: General: Pt awake/alert/oriented x4 in no acute distress Chest: cta. No chest wall pain w good excursion CV: Pulses intact. Regular rhythm MS: Normal AROM mjr joints. No obvious deformity Abdomen: +bs, abdomen is soft, incision is clean, dressing removed, staples in place, no erythema.  Tender over incision.  Ext: SCDs BLE. No mjr edema. No cyanosis Skin: No petechiae / purpura   Problem List:   Active Problems:   Incarcerated incisional hernia   Surgery, elective    Results:   Labs: Results for orders placed or performed during the hospital encounter of 09/24/15 (from the past 48 hour(s))  Lipase, blood     Status: None   Collection Time: 09/24/15 10:27 AM  Result Value Ref Range   Lipase 32 11 - 51 U/L  Comprehensive metabolic panel     Status: Abnormal   Collection Time: 09/24/15 10:27 AM  Result Value Ref Range   Sodium 135 135 - 145 mmol/L   Potassium 3.4 (L) 3.5 - 5.1 mmol/L   Chloride 97 (L) 101 - 111 mmol/L   CO2 21 (L) 22 - 32 mmol/L   Glucose, Bld 118 (H) 65 - 99 mg/dL   BUN 14 6 - 20 mg/dL   Creatinine, Ser 0.94  0.44 - 1.00 mg/dL   Calcium 9.2 8.9 - 10.3 mg/dL   Total Protein 7.9 6.5 - 8.1 g/dL   Albumin 3.8 3.5 - 5.0 g/dL   AST 26 15 - 41 U/L   ALT 26 14 - 54 U/L   Alkaline Phosphatase 56 38 - 126 U/L   Total Bilirubin 1.1 0.3 - 1.2 mg/dL   GFR calc non Af Amer >60 >60 mL/min   GFR calc Af Amer >60 >60 mL/min    Comment: (NOTE) The eGFR has been calculated using the CKD EPI equation. This calculation has not been validated in all clinical situations. eGFR's persistently <60 mL/min signify possible Chronic Kidney Disease.    Anion gap 17 (H) 5 - 15  CBC     Status: Abnormal   Collection Time: 09/24/15 10:27 AM  Result Value Ref Range   WBC 12.4 (H) 4.0 - 10.5 K/uL   RBC 5.40 (H) 3.87 - 5.11 MIL/uL   Hemoglobin 15.0 12.0 - 15.0 g/dL   HCT 45.0 36.0 - 46.0 %   MCV 83.3 78.0 - 100.0 fL   MCH 27.8 26.0 - 34.0 pg   MCHC 33.3 30.0 - 36.0 g/dL  RDW 13.9 11.5 - 15.5 %   Platelets 346 150 - 400 K/uL  Urinalysis, Routine w reflex microscopic (not at [facility]     Status: Abnormal   Collection Time: 09/24/15 11:35 AM  Result Value Ref Range   Color, Urine AMBER (A) YELLOW    Comment: BIOCHEMICALS MAY BE AFFECTED BY COLOR   APPearance CLEAR CLEAR   Specific Gravity, Urine 1.027 1.005 - 1.030   pH 5.0 5.0 - 8.0   Glucose, UA NEGATIVE NEGATIVE mg/dL   Hgb urine dipstick NEGATIVE NEGATIVE   Bilirubin Urine MODERATE (A) NEGATIVE   Ketones, ur >80 (A) NEGATIVE mg/dL   Protein, ur 30 (A) NEGATIVE mg/dL   Nitrite NEGATIVE NEGATIVE   Leukocytes, UA TRACE (A) NEGATIVE  Urine microscopic-add on     Status: Abnormal   Collection Time: 09/24/15 11:35 AM  Result Value Ref Range   Squamous Epithelial / LPF 0-5 (A) NONE SEEN   WBC, UA 0-5 0 - 5 WBC/hpf   RBC / HPF 0-5 0 - 5 RBC/hpf   Bacteria, UA RARE (A) NONE SEEN  Basic metabolic panel     Status: Abnormal   Collection Time: 09/25/15  5:42 AM  Result Value Ref Range   Sodium 139 135 - 145 mmol/L   Potassium 3.6 3.5 - 5.1 mmol/L   Chloride 105 101  - 111 mmol/L   CO2 25 22 - 32 mmol/L   Glucose, Bld 132 (H) 65 - 99 mg/dL   BUN 9 6 - 20 mg/dL   Creatinine, Ser 0.84 0.44 - 1.00 mg/dL   Calcium 8.1 (L) 8.9 - 10.3 mg/dL   GFR calc non Af Amer >60 >60 mL/min   GFR calc Af Amer >60 >60 mL/min    Comment: (NOTE) The eGFR has been calculated using the CKD EPI equation. This calculation has not been validated in all clinical situations. eGFR's persistently <60 mL/min signify possible Chronic Kidney Disease.    Anion gap 9 5 - 15  CBC     Status: Abnormal   Collection Time: 09/25/15  5:42 AM  Result Value Ref Range   WBC 12.4 (H) 4.0 - 10.5 K/uL   RBC 4.58 3.87 - 5.11 MIL/uL   Hemoglobin 12.6 12.0 - 15.0 g/dL   HCT 38.6 36.0 - 46.0 %   MCV 84.3 78.0 - 100.0 fL   MCH 27.5 26.0 - 34.0 pg   MCHC 32.6 30.0 - 36.0 g/dL   RDW 14.1 11.5 - 15.5 %   Platelets 281 150 - 400 K/uL  Basic metabolic panel     Status: Abnormal   Collection Time: 09/26/15  8:06 AM  Result Value Ref Range   Sodium 137 135 - 145 mmol/L   Potassium 3.1 (L) 3.5 - 5.1 mmol/L   Chloride 99 (L) 101 - 111 mmol/L   CO2 26 22 - 32 mmol/L   Glucose, Bld 121 (H) 65 - 99 mg/dL   BUN 6 6 - 20 mg/dL   Creatinine, Ser 0.83 0.44 - 1.00 mg/dL   Calcium 8.3 (L) 8.9 - 10.3 mg/dL   GFR calc non Af Amer >60 >60 mL/min   GFR calc Af Amer >60 >60 mL/min    Comment: (NOTE) The eGFR has been calculated using the CKD EPI equation. This calculation has not been validated in all clinical situations. eGFR's persistently <60 mL/min signify possible Chronic Kidney Disease.    Anion gap 12 5 - 15    Imaging / Studies: Ct Abdomen Pelvis W  Contrast  09/24/2015  CLINICAL DATA:  65 year old female with abdominal pain vomiting and constipation for 6 days. Initial encounter. EXAM: CT ABDOMEN AND PELVIS WITH CONTRAST TECHNIQUE: Multidetector CT imaging of the abdomen and pelvis was performed using the standard protocol following bolus administration of intravenous contrast. CONTRAST:  184m  OMNIPAQUE IOHEXOL 300 MG/ML  SOLN COMPARISON:  Acute abdominal series today. FINDINGS: Tiny calcified granuloma at the left lung base. Minimal lung base scarring or atelectasis. No pericardial or pleural effusion. Mild grade 1 anterolisthesis in the lower lumbar spine with associated advanced facet arthropathy. No acute osseous abnormality identified. No pelvic free fluid. Diminutive urinary bladder. There is a small 2 cm simple fluid attenuation cyst associated with the left ovary (series 2, image 85). Otherwise negative in uterus and adnexa. Decompressed large bowel from the mid transverse colon to the rectum. The mid transverse colon is herniated into a right paracentral paraumbilical with a relatively narrow neck (sagittal image 77). This hernias obstructing the colon, which is then moderately to severely dilated with fluid proximally. There is also widespread fluid dilatation of small bowel. There is also some retained fluid in the thoracic esophagus. Appendix incidentally noted to be normal. The hernia sac is 10-12 cm diameter, and contains large bowel and mesentery. No abdominal free air or free fluid. Liver, gallbladder, spleen, pancreas and adrenal glands are within normal limits. Portal venous system is patent. Aortoiliac calcified atherosclerosis noted. Major arterial structures are patent. Bilateral renal enhancement and contrast excretion within normal limits ; small benign bilateral parapelvic renal cysts. No lymphadenopathy. IMPRESSION: 1. Herniated and obstructed transverse colon; incarcerated periumbilical hernia with relatively (narrow neck 3-4 cm versus 12 cm hernia) containing the mid transverse colon and mesenteric. Upstream high-grade bowel obstruction, but no free air or free fluid in the abdomen or pelvis. 2. Otherwise benign findings in the abdomen and pelvis, including a small simple appearing left ovarian cyst which in this age group would be recommended for annual pelvis ultrasound  follow-up This recommendation follows the consensus statement: Management of Asymptomatic Ovarian and Other Adnexal Cysts Imaged at [place] 2010; [phone]). Electronically Signed   By: HGenevie AnnM.D.   On: 09/24/2015 15:12   Dg Abd Acute W/chest  09/24/2015  CLINICAL DATA:  Vomiting for 3 days. EXAM: DG ABDOMEN ACUTE W/ 1V CHEST COMPARISON:  Chest radiographs 08/07/2008. FINDINGS: The heart size and mediastinal contours are stable with mild aortic atherosclerosis. There is chronic asymmetric elevation of the left hemidiaphragm. Bibasilar atelectasis has improved compared with the prior study. There is no confluent airspace opacity, edema or pleural effusion. There are several moderately dilated loops of small bowel within the mid abdomen with air-fluid levels at different levels on the erect examination. The colon appears decompressed. There is no evidence of free intraperitoneal air, definite bowel wall thickening or suspicious abdominal calcification. IMPRESSION: 1. The bowel gas pattern is consistent with a mid small bowel obstruction. Consider CT for further evaluation of potential etiology. 2. No evidence of bowel perforation. 3. Mild bibasilar atelectasis, improved from 2009 chest radiographs. Electronically Signed   By: WRichardean SaleM.D.   On: 09/24/2015 13:27    Medications / Allergies:  Scheduled Meds: . antiseptic oral rinse  7 mL Mouth Rinse BID  . enoxaparin (LOVENOX) injection  40 mg Subcutaneous Q24H  . pantoprazole (PROTONIX) IV  40 mg Intravenous QHS   Continuous Infusions: . dextrose 5 % and 0.9 % NaCl with KCl  20 mEq/L 100 mL/hr at 09/26/15 0720   PRN Meds:.acetaminophen **OR** acetaminophen, morphine injection, ondansetron **OR** ondansetron (ZOFRAN) IV  Antibiotics: Anti-infectives    Start     Dose/Rate Route Frequency Ordered Stop   09/24/15 1645  ceFAZolin (ANCEF) IVPB 2 g/50 mL premix     Comments:  Pharmacy may adjust dosing strength, interval, or rate of medication as needed for optimal therapy for the patient  Send with patient on call to the OR.  Anesthesia to complete antibiotic administration <33mn [facility]   2 g 100 mL/hr over 30 Minutes Intravenous On call to O.R. 09/24/15 1637 09/24/15 1933        Assessment/Plan Incarcerated ventral incisional hernia with obstruction  POD#2 repair of incisional ventral hernia---Dr. BBarry Dienes-clears, add PO pain meds, continue mobilization and IS VTE prophylaxis-SCD/lovenox FEN-reduce IVF.  Hypokalemia-supplement, labs in AM Dispo-continue inpatient    EErby Pian [facility] Pager [phone](7A-4:30P) For consults and floor pages call [phone](7A-4:30P)  09/26/2015 9:50 AM

## 2015-09-27 LAB — CBC
HEMATOCRIT: 33.1 % — AB (ref 36.0–46.0)
HEMOGLOBIN: 10.3 g/dL — AB (ref 12.0–15.0)
MCH: 26.5 pg (ref 26.0–34.0)
MCHC: 31.1 g/dL (ref 30.0–36.0)
MCV: 85.3 fL (ref 78.0–100.0)
Platelets: 231 10*3/uL (ref 150–400)
RBC: 3.88 MIL/uL (ref 3.87–5.11)
RDW: 14.5 % (ref 11.5–15.5)
WBC: 8 10*3/uL (ref 4.0–10.5)

## 2015-09-27 LAB — BASIC METABOLIC PANEL
ANION GAP: 5 (ref 5–15)
BUN: <5 mg/dL — ABNORMAL LOW (ref 6–20)
CHLORIDE: 107 mmol/L (ref 101–111)
CO2: 26 mmol/L (ref 22–32)
Calcium: 8.1 mg/dL — ABNORMAL LOW (ref 8.9–10.3)
Creatinine, Ser: 0.77 mg/dL (ref 0.44–1.00)
GFR calc non Af Amer: >60 mL/min (ref >60)
Glucose, Bld: 95 mg/dL (ref 65–99)
POTASSIUM: 3.5 mmol/L (ref 3.5–5.1)
Sodium: 138 mmol/L (ref 135–145)

## 2015-09-27 LAB — URINE MICROSCOPIC-ADD ON: RBC / HPF: NONE SEEN RBC/hpf (ref 0–5)

## 2015-09-27 LAB — URINALYSIS, ROUTINE W REFLEX MICROSCOPIC
Glucose, UA: NEGATIVE mg/dL
Ketones, ur: NEGATIVE mg/dL
LEUKOCYTES UA: NEGATIVE
NITRITE: NEGATIVE
PH: 5 (ref 5.0–8.0)
Protein, ur: NEGATIVE mg/dL
SPECIFIC GRAVITY, URINE: 1.024 (ref 1.005–1.030)

## 2015-09-27 MED ORDER — SODIUM CHLORIDE 0.9% FLUSH: 3.0000 mL | INTRAVENOUS | Status: DC | PRN

## 2015-09-27 MED ORDER — PANTOPRAZOLE SODIUM 40 MG PO TBEC
40.0000 mg | DELAYED_RELEASE_TABLET | Freq: Every day | ORAL | Status: DC
  Administered 2015-09-27: 40 mg via ORAL
  Filled 2015-09-27: qty 1

## 2015-09-27 MED ORDER — SODIUM CHLORIDE 0.9% FLUSH: 3.0000 mL | Freq: Two times a day (BID) | INTRAVENOUS | Status: DC

## 2015-09-27 NOTE — [place] Notes (Signed)
Patient ID: Sherry Meyer, female   DOB: 1951/01/14, 65 y.o.   MRN: [id number]     [facility]      [address] [place], [place] [address]    Phone: [phone] FAX: [phone]     Subjective: C/o change in urine, ?brown, ?hematuria. Had 2 BMs. VSS.  Afebrile.  Leukocytosis resolved.  k stable.   Objective:  Vital signs:  Filed Vitals:   09/26/15 0929 09/26/15 1357 09/26/15 2137 09/27/15 0530  BP: 116/66 107/60 126/64 119/70  Pulse: 90 86 79 85  Temp: 99.4 F (37.4 C) 98 F (36.7 C) 98.6 F (37 C) 98.9 F (37.2 C)  TempSrc: Oral Oral Oral Oral  Resp: 16 16 17 17   Height:      Weight:      SpO2: 96% 97% 96% 98%    Last BM Date: 09/26/15  Intake/Output   Yesterday:  02/09 0701 - 02/10 0700 In: 2533.8 [P.O.:978; I.V.:1555.8] Out: 500 [Urine:500] This shift:    I/O last 3 completed shifts: In: 3082.2 [P.O.:978; I.V.:2104.2] Out: 700 [Urine:700]   Physical Exam: General: Pt awake/alert/oriented x4 in no acute distress Chest: cta. No chest wall pain w good excursion CV: Pulses intact. Regular rhythm MS: Normal AROM mjr joints. No obvious deformity Abdomen: +bs, abdomen is soft, incision is clean, dressing removed, staples in place, no erythema.  Tender over incision.  Ext: SCDs BLE. No mjr edema. No cyanosis Skin: No petechiae / purpura  Problem List:   Active Problems:   Incarcerated incisional hernia   Surgery, elective    Results:   Labs: Results for orders placed or performed during the hospital encounter of 09/24/15 (from the past 48 hour(s))  Basic metabolic panel     Status: Abnormal   Collection Time: 09/26/15  8:06 AM  Result Value Ref Range   Sodium 137 135 - 145 mmol/L   Potassium 3.1 (L) 3.5 - 5.1 mmol/L   Chloride 99 (L) 101 - 111 mmol/L   CO2 26 22 - 32 mmol/L   Glucose, Bld 121 (H) 65 - 99 mg/dL   BUN 6 6 - 20 mg/dL   Creatinine, Ser 0.83 0.44 - 1.00 mg/dL   Calcium 8.3  (L) 8.9 - 10.3 mg/dL   GFR calc non Af Amer >60 >60 mL/min   GFR calc Af Amer >60 >60 mL/min    Comment: (NOTE) The eGFR has been calculated using the CKD EPI equation. This calculation has not been validated in all clinical situations. eGFR's persistently <60 mL/min signify possible Chronic Kidney Disease.    Anion gap 12 5 - 15  CBC     Status: Abnormal   Collection Time: 09/27/15  4:30 AM  Result Value Ref Range   WBC 8.0 4.0 - 10.5 K/uL   RBC 3.88 3.87 - 5.11 MIL/uL   Hemoglobin 10.3 (L) 12.0 - 15.0 g/dL   HCT 33.1 (L) 36.0 - 46.0 %   MCV 85.3 78.0 - 100.0 fL   MCH 26.5 26.0 - 34.0 pg   MCHC 31.1 30.0 - 36.0 g/dL   RDW 14.5 11.5 - 15.5 %   Platelets 231 150 - 400 K/uL  Basic metabolic panel     Status: Abnormal   Collection Time: 09/27/15  4:30 AM  Result Value Ref Range   Sodium 138 135 - 145 mmol/L   Potassium 3.5 3.5 - 5.1 mmol/L   Chloride 107 101 - 111 mmol/L   CO2 26  22 - 32 mmol/L   Glucose, Bld 95 65 - 99 mg/dL   BUN <5 (L) 6 - 20 mg/dL   Creatinine, Ser 0.77 0.44 - 1.00 mg/dL   Calcium 8.1 (L) 8.9 - 10.3 mg/dL   GFR calc non Af Amer >60 >60 mL/min   GFR calc Af Amer >60 >60 mL/min    Comment: (NOTE) The eGFR has been calculated using the CKD EPI equation. This calculation has not been validated in all clinical situations. eGFR's persistently <60 mL/min signify possible Chronic Kidney Disease.    Anion gap 5 5 - 15    Imaging / Studies: No results found.  Medications / Allergies:  Scheduled Meds: . antiseptic oral rinse  7 mL Mouth Rinse BID  . diphenhydrAMINE  25 mg Oral QHS  . enoxaparin (LOVENOX) injection  40 mg Subcutaneous Q24H  . pantoprazole (PROTONIX) IV  40 mg Intravenous QHS   Continuous Infusions: . dextrose 5 % and 0.9 % NaCl with KCl 20 mEq/L 50 mL/hr at 09/26/15 1007   PRN Meds:.acetaminophen **OR** acetaminophen, morphine injection, ondansetron **OR** ondansetron (ZOFRAN) IV, oxyCODONE-acetaminophen  Antibiotics: Anti-infectives     Start     Dose/Rate Route Frequency Ordered Stop   09/24/15 1645  ceFAZolin (ANCEF) IVPB 2 g/50 mL premix    Comments:  Pharmacy may adjust dosing strength, interval, or rate of medication as needed for optimal therapy for the patient  Send with patient on call to the OR.  Anesthesia to complete antibiotic administration <59mn [facility]   2 g 100 mL/hr over 30 Minutes Intravenous On call to O.R. 09/24/15 1637 09/24/15 1933        Assessment/Plan Incarcerated ventral incisional hernia with obstruction  POD#3 repair of incisional ventral hernia---Dr. BBarry Dienes-soft diet, mobilize, IS, abdominal binder Dysuria-check UA VTE prophylaxis-SCD/lovenox FEN-SLIV. Hypokalemia-resolved Dispo-DC in AM, follow up arranged.  EErby Pian [facility] Pager [phone] For consults and floor pages call [phone](7A-4:30P)  09/27/2015 9:10 AM

## 2015-09-27 NOTE — Discharge Instructions (Signed)
[facility] _______[facility]  UMBILICAL OR INGUINAL HERNIA REPAIR: POST OP INSTRUCTIONS  Always review your discharge instruction sheet given to you by the facility where your surgery was performed. IF YOU HAVE DISABILITY OR FAMILY LEAVE FORMS, YOU MUST BRING THEM TO THE OFFICE FOR PROCESSING.   DO NOT GIVE THEM TO YOUR DOCTOR.  1. A  prescription for pain medication may be given to you upon discharge.  Take your pain medication as prescribed, if needed.  If narcotic pain medicine is not needed, then you may take acetaminophen (Tylenol) or ibuprofen (Advil) as needed. 2. Take your usually prescribed medications unless otherwise directed. 3. If you need a refill on your pain medication, please contact your pharmacy.  They will contact our office to request authorization. Prescriptions will not be filled after 5 pm or on week-ends. 4. You should follow a light diet the first 24 hours after arrival home, such as soup and crackers, etc.  Be sure to include lots of fluids daily.  Resume your normal diet the day after surgery. 5. Most patients will experience some swelling and bruising around the umbilicus or in the groin and scrotum.  Ice packs and reclining will help.  Swelling and bruising can take several days to resolve.  6. It is common to experience some constipation if taking pain medication after surgery.  Increasing fluid intake and taking a stool softener (such as Colace) will usually help or prevent this problem from occurring.  A mild laxative (Milk of Magnesia or Miralax) should be taken according to package directions if there are no bowel movements after 48 hours. 7. Unless discharge instructions indicate otherwise, you may remove your bandages 24-48 hours after surgery, and you may shower at that time.  You may have steri-strips (small skin tapes) in place directly over the incision.  These strips should be left on the skin for 7-10 days.  If your surgeon used skin glue on the  incision, you may shower in 24 hours.  The glue will flake off over the next 2-3 weeks.  Any sutures or staples will be removed at the office during your follow-up visit. 8. ACTIVITIES:  You may resume regular (light) daily activities beginning the next day--such as daily self-care, walking, climbing stairs--gradually increasing activities as tolerated.  You may have sexual intercourse when it is comfortable.  Refrain from any heavy lifting or straining until approved by your doctor. a. You may drive when you are no longer taking prescription pain medication, you can comfortably wear a seatbelt, and you can safely maneuver your car and apply brakes. b. RETURN TO WORK:  __________________________________________________________ 9. You should see your doctor in the office for a follow-up appointment approximately 2-3 weeks after your surgery.  Make sure that you call for this appointment within a day or two after you arrive home to insure a convenient appointment time. 10. OTHER INSTRUCTIONS:  __________________________________________________________________________________________________________________________________________________________________________________________  WHEN TO CALL YOUR DOCTOR: 1. Fever over 101.0 2. Inability to urinate 3. Nausea and/or vomiting 4. Extreme swelling or bruising 5. Continued bleeding from incision. 6. Increased pain, redness, or drainage from the incision  The clinic staff is available to answer your questions during regular business hours.  Please don't hesitate to call and ask to speak to one of the nurses for clinical concerns.  If you have a medical emergency, go to the nearest emergency room or call 911.  A surgeon from [facility] is always on call at the hospital     [address], [address], [place], [place]  [address] ?  [address] [address], [place], [place]   [address] [phone] ? [phone] ? FAX [phone] Web site:  [email or web address]  

## 2015-09-28 MED ORDER — OXYCODONE-ACETAMINOPHEN 5-325 MG PO TABS: 1.0000 | ORAL_TABLET | Freq: Four times a day (QID) | ORAL | Status: AC | PRN

## 2015-09-28 NOTE — Discharge Summary (Signed)
[facility] Discharge Summary   Patient ID: IMAJEAN Meyer MRN: [id number] DOB/AGE: [birth year] 65 y.o.  Admit date: 09/24/2015 Discharge date: 09/28/2015  Admitting Diagnosis: Incarcerated incisional hernia  Discharge Diagnosis Patient Active Problem List   Diagnosis Date Noted  . Incarcerated incisional hernia 09/24/2015  . Surgery, elective 09/24/2015  . BMI 39.0-39.9,adult 09/23/2015    Consultants None  Imaging: No results found.  Procedures Dr. Donell Beers (09/24/15) - Repair of incarcerated incisional ventral hernia  Hospital Course:  Sherry Meyer is a 65 year old female with a history of seizures who presents with persistent abdominal pain, nausea and vomiting. Started last Wednesday. She was seen in the ED on Friday due to a presumed seizure. AXR at this time suggested a PSBO. She was sent home, symptoms never improved. Has not been able to keep much liquids down. Associated with chills, constipation, last BM being 1 week ago. She has a history of a hiatal hernia repair in 1991. Denies any cardiac history.   Work up shows, WBC 12.4k, K 3.4, normal renal function. CT of a/p showed a incarcerated umbilical hernia with obstruction. We have therefore been asked to evaluate.  Patient was admitted and underwent procedure listed above.  Tolerated procedure well and was transferred to the floor.  Diet was advanced as tolerated.  Hypokalemia resolved.  She had some dysuria, which resolved and UA not concerning for infection (contaminated).  On POD #4, the patient was voiding well, tolerating diet, ambulating well, pain well controlled, vital signs stable, incisions c/d/i and felt stable for discharge home.  Patient will follow up in our office for staple removal and a post op check in 2-3 weeks and knows to call with questions or concerns.    Physical Exam: General:  Alert, NAD, pleasant, comfortable Abd:  Soft, ND, mild tenderness, incisions C/D/I with staples in  place, no significant erythema or induration    Medication List    TAKE these medications        ADVIL PM 200-25 MG Caps  Generic drug:  Ibuprofen-Diphenhydramine HCl  Take 1 tablet by mouth at bedtime as needed (sleep). sleep     dicyclomine 20 MG tablet  Commonly known as:  BENTYL  Take 1 tablet (20 mg total) by mouth 2 (two) times daily.     ondansetron 4 MG disintegrating tablet  Commonly known as:  ZOFRAN ODT  Take 1 tablet (4 mg total) by mouth every 8 (eight) hours as needed for nausea or vomiting.     oxyCODONE-acetaminophen 5-325 MG tablet  Commonly known as:  PERCOCET/ROXICET  Take 1-2 tablets by mouth every 6 (six) hours as needed for moderate pain or severe pain.     Potassium Chloride ER 20 MEQ Tbcr  Take 20 mEq by mouth daily.             Follow-up Information    Follow up with [facility] On 10/04/2015.   Specialty:  General Surgery   Why:  appt time 10AM to have staples removed by nurse.   Contact information:   [address] [place] [address] [phone]       Follow up with Almond Lint, MD On 10/14/2015.   Specialty:  General Surgery   Why:  arrive by 2:30PM for a 3PM post op check with your surgeon   Contact information:   [address] [place] [address] [phone]       Signed: Nonie Hoyer, Sherry Meyer  [facility] [phone]  09/28/2015, 7:29 AM

## 2015-09-28 NOTE — [place] Notes (Signed)
Patient discharged home in stable condition. Verbalizes understanding of all discharge instructions, including home medications and follow up appointments. 

## 2018-02-07 IMAGING — CT CT ABD-PELV W/ CM
2 of 5 series · 16 of 46 positions shown, 18 images · IV contrast (Omni 300)
Comparison: Acute abdominal series today.

CLINICAL DATA: 64-year-old female with abdominal pain vomiting and
constipation for 6 days. Initial encounter.

EXAM:
CT ABDOMEN AND PELVIS WITH CONTRAST
TECHNIQUE: Multidetector CT imaging of the abdomen and pelvis was performed
using the standard protocol following bolus administration of
intravenous contrast.
CONTRAST:  100mL OMNIPAQUE IOHEXOL 300 MG/ML  SOLN

[Series 2: a/p w/ 5mm · axial · 0.78mm/px · z∈[-514,-79]mm · 13 of 99 slices shown, 15 images]
[im 6/99  soft-tissue]
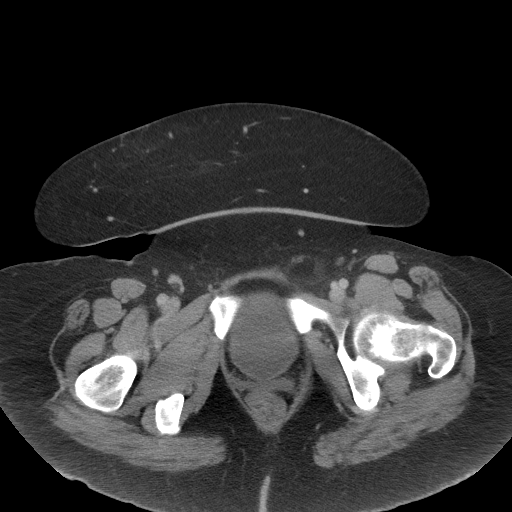
[im 6/99  bone]
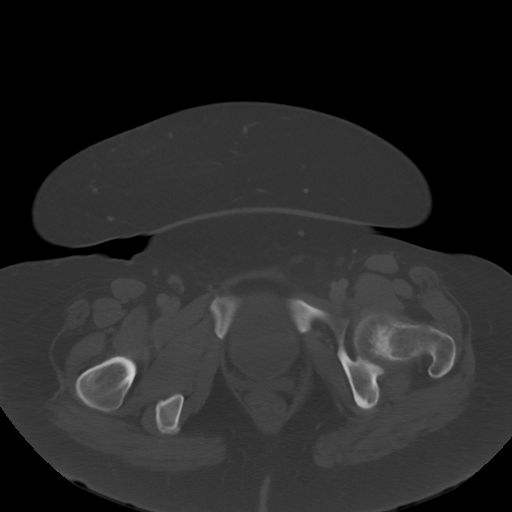
[im 11/99  soft-tissue]
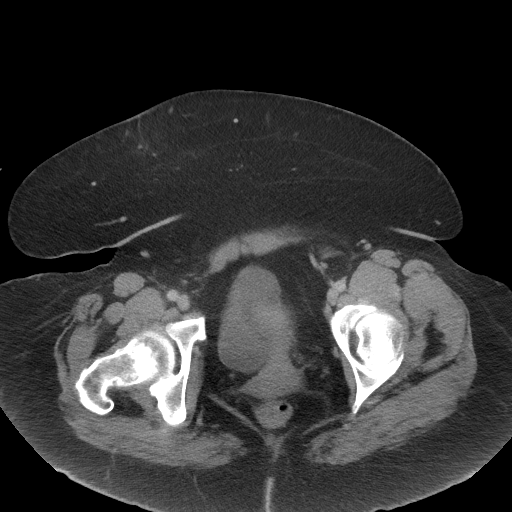
[im 22/99  soft-tissue]
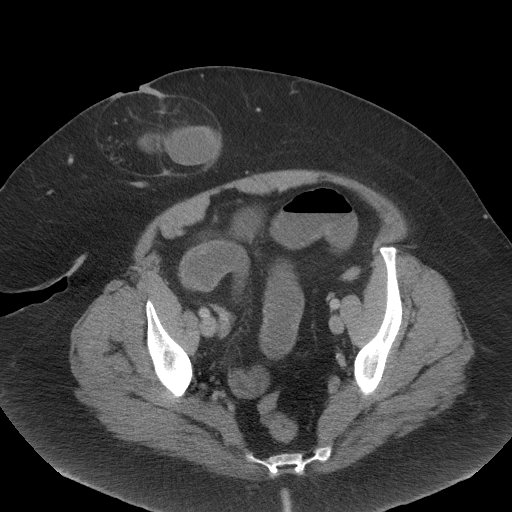
[im 28/99  soft-tissue]
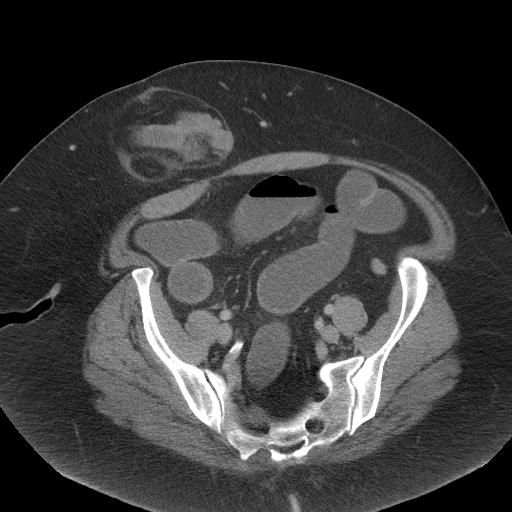
[im 33/99  soft-tissue]
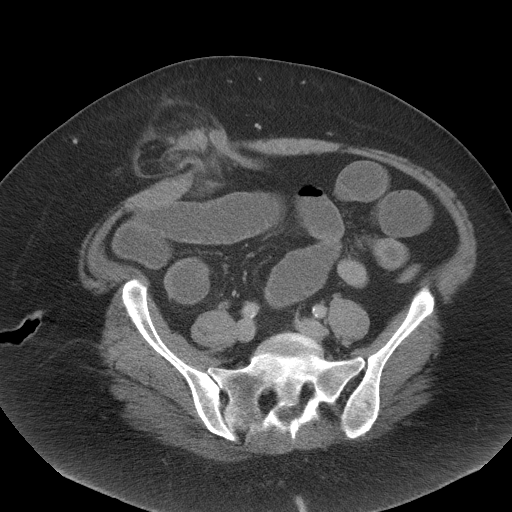
[im 44/99  soft-tissue]
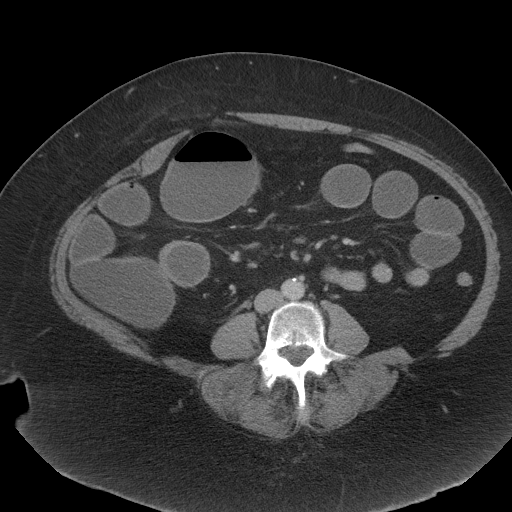
[im 50/99  soft-tissue]
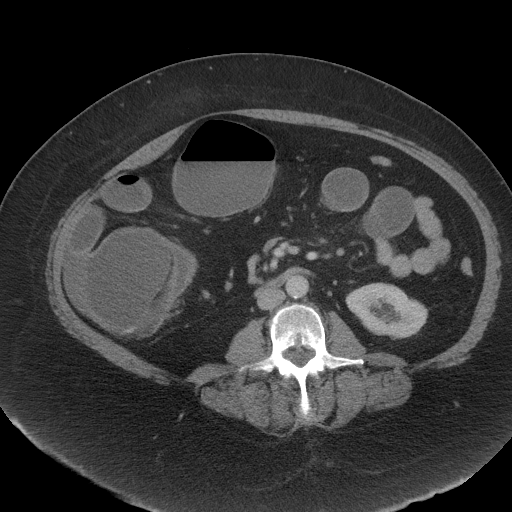
[im 55/99  soft-tissue]
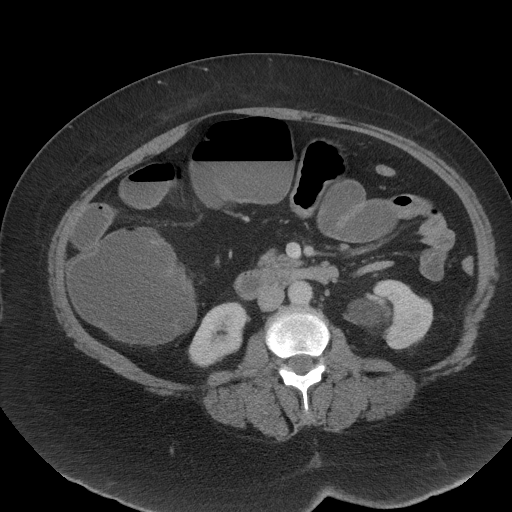
[im 66/99  soft-tissue]
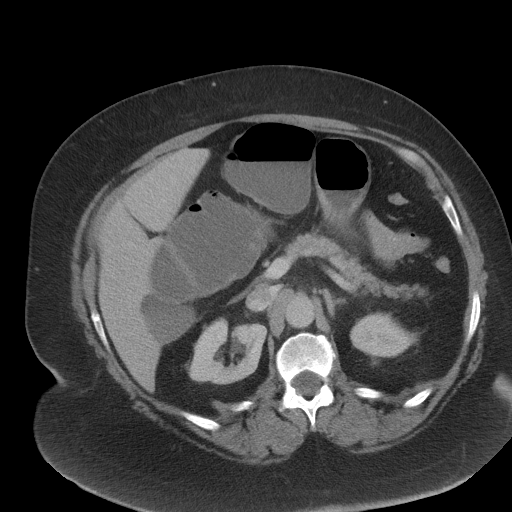
[im 66/99  bone]
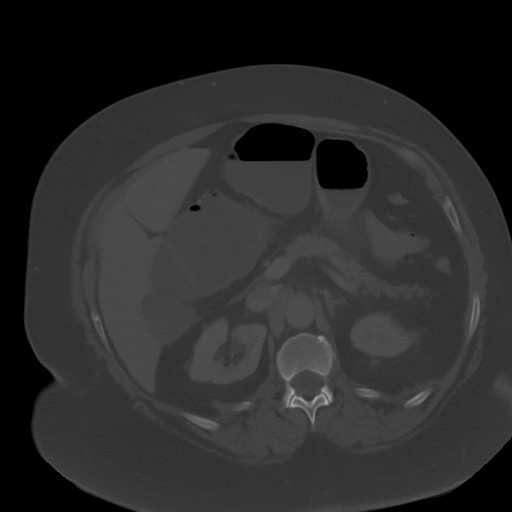
[im 71/99  soft-tissue]
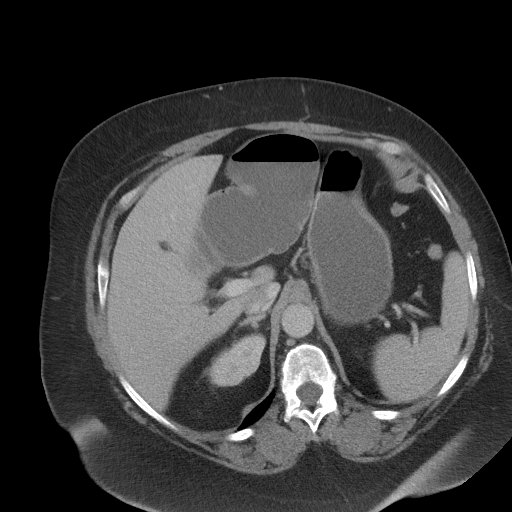
[im 77/99  soft-tissue]
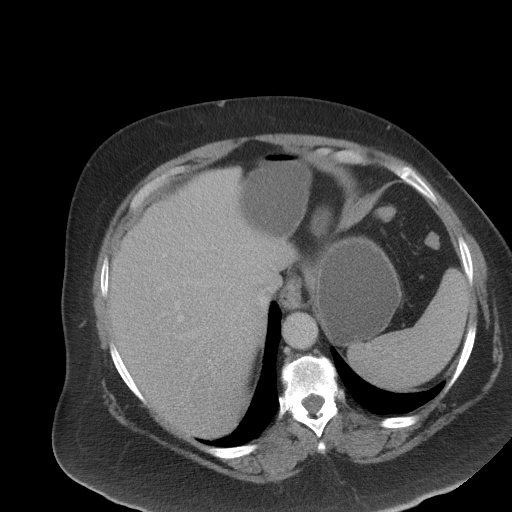
[im 88/99  soft-tissue]
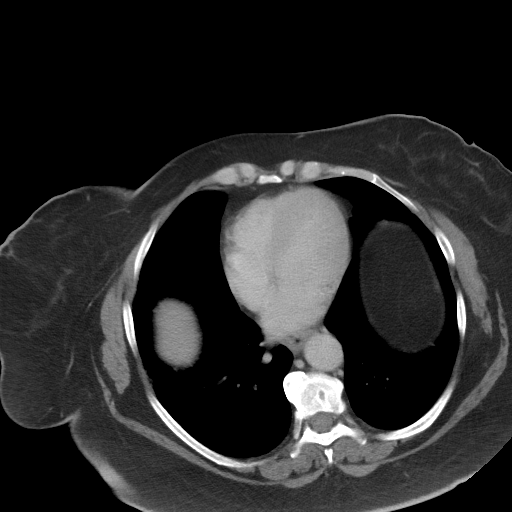
[im 93/99  soft-tissue]
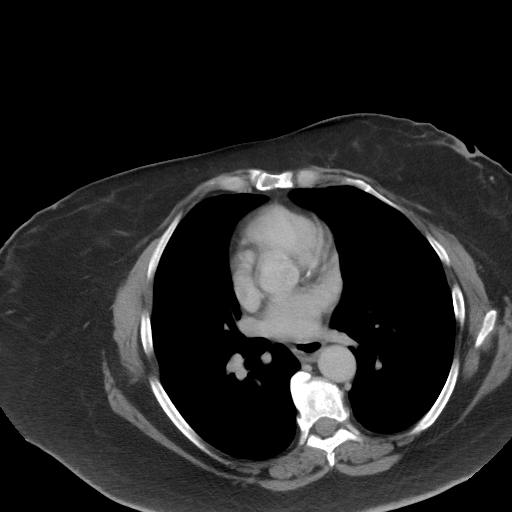

[Series 6: a/p w/ cor · coronal · 0.83mm/px · 3 of 151 slices shown]
[im 51/151  soft-tissue]
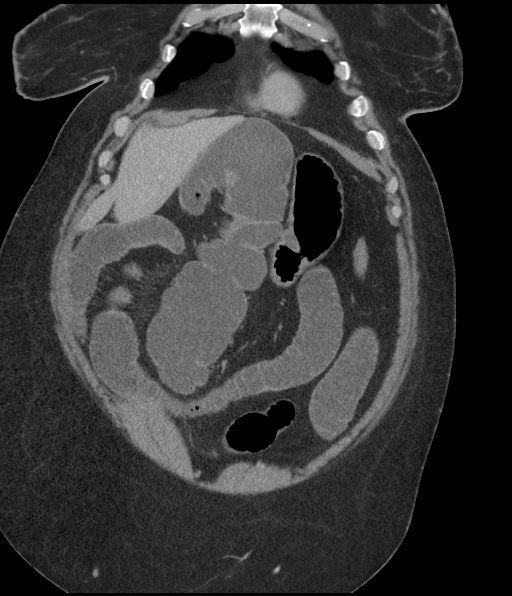
[im 67/151  soft-tissue]
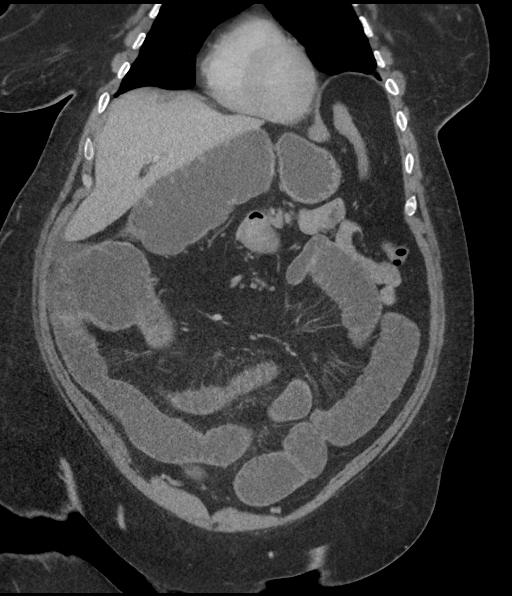
[im 84/151  soft-tissue]
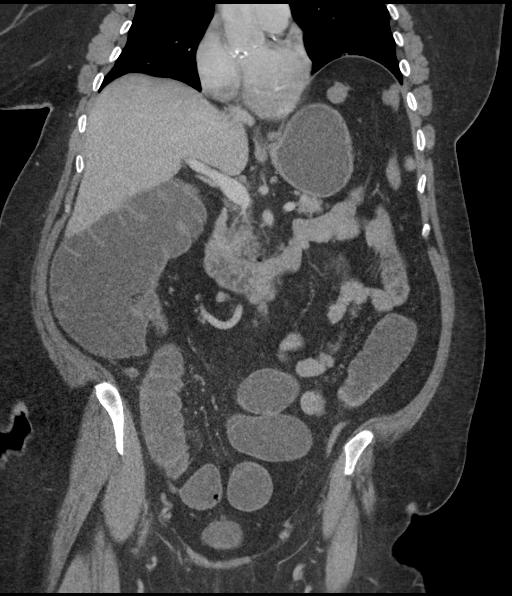

[16 of 46 positions shown; findings below may reference images not displayed]

FINDINGS: Tiny calcified granuloma at the left lung base. Minimal lung base
scarring or atelectasis. No pericardial or pleural effusion.

Mild grade 1 anterolisthesis in the lower lumbar spine with
associated advanced facet arthropathy. No acute osseous abnormality
identified.

No pelvic free fluid. Diminutive urinary bladder. There is a small 2
cm simple fluid attenuation cyst associated with the left ovary
(series 2, image 85). Otherwise negative in uterus and adnexa.

Decompressed large bowel from the mid transverse colon to the
rectum. The mid transverse colon is herniated into a right
paracentral paraumbilical with a relatively narrow neck (sagittal
image 77). This hernias obstructing the colon, which is then
moderately to severely dilated with fluid proximally. There is also
widespread fluid dilatation of small bowel. There is also some
retained fluid in the thoracic esophagus. Appendix incidentally
noted to be normal. The hernia sac is 10-12 cm diameter, and
contains large bowel and mesentery.

No abdominal free air or free fluid. Liver, gallbladder, spleen,
pancreas and adrenal glands are within normal limits. Portal venous
system is patent. Aortoiliac calcified atherosclerosis noted. Major
arterial structures are patent. Bilateral renal enhancement and
contrast excretion within normal limits ; small benign bilateral
parapelvic renal cysts. No lymphadenopathy.
IMPRESSION: 1. Herniated and obstructed transverse colon; incarcerated
periumbilical hernia with relatively (narrow neck 3-4 cm versus 12
cm hernia) containing the mid transverse colon and mesenteric.
Upstream high-grade bowel obstruction, but no free air or free fluid
in the abdomen or pelvis.
2. Otherwise benign findings in the abdomen and pelvis, including a
small simple appearing left ovarian cyst which in this age group
would be recommended for annual pelvis ultrasound follow-up This
recommendation follows the consensus statement: Management of
Asymptomatic Ovarian and Other Adnexal Cysts Imaged at US: Society
of Radiologists in Ultrasound Consensus Conference Statement.
